# Patient Record
Sex: Male | Born: 1980 | Race: Black or African American | Hispanic: No | Marital: Married | Smoking: Never smoker
Health system: Southern US, Community
[De-identification: ages and names within clinical notes are randomized; demographics above are authoritative.]

## PROBLEM LIST (undated history)

## (undated) DIAGNOSIS — I10 Essential (primary) hypertension: Secondary | ICD-10-CM

## (undated) HISTORY — PX: VASECTOMY: SHX75

## (undated) HISTORY — DX: Essential (primary) hypertension: I10

---

## 2012-10-15 ENCOUNTER — Encounter: Payer: Self-pay | Admitting: Family Medicine

## 2012-10-15 ENCOUNTER — Ambulatory Visit (INDEPENDENT_AMBULATORY_CARE_PROVIDER_SITE_OTHER): Payer: BC Managed Care – PPO | Admitting: Family Medicine

## 2012-10-15 ENCOUNTER — Ambulatory Visit (HOSPITAL_COMMUNITY): Admission: RE | Admit: 2012-10-15 | Payer: BC Managed Care – PPO | Source: Ambulatory Visit

## 2012-10-15 VITALS — BP 170/114 | HR 103 | Temp 97.8°F | Ht 78.0 in | Wt 225.0 lb

## 2012-10-15 DIAGNOSIS — E663 Overweight: Secondary | ICD-10-CM

## 2012-10-15 DIAGNOSIS — N5089 Other specified disorders of the male genital organs: Secondary | ICD-10-CM

## 2012-10-15 DIAGNOSIS — N508 Other specified disorders of male genital organs: Secondary | ICD-10-CM

## 2012-10-15 DIAGNOSIS — Z23 Encounter for immunization: Secondary | ICD-10-CM

## 2012-10-15 DIAGNOSIS — Z6825 Body mass index (BMI) 25.0-25.9, adult: Secondary | ICD-10-CM

## 2012-10-15 DIAGNOSIS — I10 Essential (primary) hypertension: Secondary | ICD-10-CM

## 2012-10-15 LAB — POCT URINALYSIS DIPSTICK
Ketones, UA: NEGATIVE
Leukocytes, UA: NEGATIVE
Protein, UA: 30
pH, UA: 6

## 2012-10-15 LAB — BASIC METABOLIC PANEL
CO2: 26 mEq/L (ref 19–32)
Calcium: 10 mg/dL (ref 8.4–10.5)
Creat: 1.21 mg/dL (ref 0.50–1.35)

## 2012-10-15 LAB — LIPID PANEL
HDL: 58 mg/dL (ref >39)
LDL Cholesterol: 120 mg/dL — ABNORMAL HIGH (ref 0–99)
Total CHOL/HDL Ratio: 3.8 Ratio
VLDL: 44 mg/dL — ABNORMAL HIGH (ref 0–40)

## 2012-10-15 MED ORDER — HYDROCHLOROTHIAZIDE 25 MG PO TABS: 25.0000 mg | ORAL_TABLET | Freq: Every day | ORAL | Status: DC

## 2012-10-15 NOTE — Patient Instructions (Addendum)
Dear Jonathan Nolan.,   It was great to see you today. Thank you for coming to clinic. Please read below regarding the issues that we discussed.   1. Your blood pressure continues to be elevated. We are going to get some basic labwork. Your EKG did show some strain on the left side of your heart from the blood pressure being elevated which means we need to make sure to follow you closely. We will repeat the EKG in a year after your blood pressure is better controlled. I would encourage you to get a blood pressure cuff and perhaps measure every other day and keep a log. The goal is for your blood pressure to be less than 140/90 2. Diet and exercise will be very important as well, see below.  3. You were updated on your flu and tetanus shot.  4. Let's reexamine your right testicle in a few weeks. If the small lump has resolved, nothing further to do. If it persists or gets bigger, we will do an ultrasound.    Please follow up in clinic in 2 weeks . Please call earlier if you have any questions or concerns.   Sincerely,  Dr. Tana Conch   My 5 to Fitness! These are tips I give to every patient that  are important for living a healthy life!   5: servings of fruits and vegetables per day (work on 9 per day if you are at 5)\  *less processed foods* 4: exercise 4-5 times per week for at least 30 minutes (walking counts!) 3: meals per day (don't skip breakfast!) 2: habits to quit  -smoking  -excess alcohol use (men >2 beer/day; women >1beer/day) 1: sweet per day (2 cookies, 1 small cup of ice cream, 12 oz soda)  These are general tips for healthy living. Try to start with 1 or 2 habit TODAY and make it a part of your life for several months. Once you have 1 or 2 habits down for several months, try to begin working on your next healthy habit. With every single step you take, you will be leading a healthier lifestyle!  DASH Diet The DASH diet stands for "Dietary Approaches to Stop  Hypertension." It is a healthy eating plan that has been shown to reduce high blood pressure (hypertension) in as little as 14 days, while also possibly providing other significant health benefits. These other health benefits include reducing the risk of breast cancer after menopause and reducing the risk of type 2 diabetes, heart disease, colon cancer, and stroke. Health benefits also include weight loss and slowing kidney failure in patients with chronic kidney disease.  DIET GUIDELINES  Limit salt (sodium). Your diet should contain less than 1500 mg of sodium daily.  Limit refined or processed carbohydrates. Your diet should include mostly whole grains. Desserts and added sugars should be used sparingly.  Include small amounts of heart-healthy fats. These types of fats include nuts, oils, and tub margarine. Limit saturated and trans fats. These fats have been shown to be harmful in the body. CHOOSING FOODS  The following food groups are based on a 2000 calorie diet. See your Registered Dietitian for individual calorie needs. Grains and Grain Products (6 to 8 servings daily)  Eat More Often: Whole-wheat bread, brown rice, whole-grain or wheat pasta, quinoa, popcorn without added fat or salt (air popped).  Eat Less Often: White bread, white pasta, white rice, cornbread. Vegetables (4 to 5 servings daily)  Eat More Often: Fresh, frozen, and  canned vegetables. Vegetables may be raw, steamed, roasted, or grilled with a minimal amount of fat.  Eat Less Often/Avoid: Creamed or fried vegetables. Vegetables in a cheese sauce. Fruit (4 to 5 servings daily)  Eat More Often: All fresh, canned (in natural juice), or frozen fruits. Dried fruits without added sugar. One hundred percent fruit juice ( cup [237 mL] daily).  Eat Less Often: Dried fruits with added sugar. Canned fruit in light or heavy syrup. [employer], Fish, and Poultry (2 servings or less daily. One serving is 3 to 4 oz [85-114  g]).  Eat More Often: Ninety percent or leaner ground beef, tenderloin, sirloin. Round cuts of beef, chicken breast, [place] breast. All fish. Grill, bake, or broil your meat. Nothing should be fried.  Eat Less Often/Avoid: Fatty cuts of meat, [place], or chicken leg, thigh, or wing. Fried cuts of meat or fish. Dairy (2 to 3 servings)  Eat More Often: Low-fat or fat-free milk, low-fat plain or light yogurt, reduced-fat or part-skim cheese.  Eat Less Often/Avoid: Milk (whole, 2%).Whole milk yogurt. Full-fat cheeses. Nuts, Seeds, and Legumes (4 to 5 servings per week)  Eat More Often: All without added salt.  Eat Less Often/Avoid: Salted nuts and seeds, canned beans with added salt. Fats and Sweets (limited)  Eat More Often: Vegetable oils, tub margarines without trans fats, sugar-free gelatin. Mayonnaise and salad dressings.  Eat Less Often/Avoid: Coconut oils, palm oils, butter, stick margarine, cream, half and half, cookies, candy, pie. FOR MORE INFORMATION The Dash Diet Eating Plan: www.dashdiet.org Document Released: 12/01/2011 Document Revised: 03/05/2012 Document Reviewed: 12/01/2011 [facility] Patient Information 2013 [place], [place].

## 2012-10-17 ENCOUNTER — Encounter: Payer: Self-pay | Admitting: Family Medicine

## 2012-10-17 ENCOUNTER — Ambulatory Visit (INDEPENDENT_AMBULATORY_CARE_PROVIDER_SITE_OTHER): Payer: BC Managed Care – PPO | Admitting: Family Medicine

## 2012-10-17 VITALS — BP 144/96 | HR 92 | Ht 78.0 in | Wt 226.0 lb

## 2012-10-17 DIAGNOSIS — M545 Low back pain: Secondary | ICD-10-CM

## 2012-10-17 DIAGNOSIS — E663 Overweight: Secondary | ICD-10-CM | POA: Insufficient documentation

## 2012-10-17 DIAGNOSIS — S39012A Strain of muscle, fascia and tendon of lower back, initial encounter: Secondary | ICD-10-CM | POA: Insufficient documentation

## 2012-10-17 DIAGNOSIS — N5089 Other specified disorders of the male genital organs: Secondary | ICD-10-CM | POA: Insufficient documentation

## 2012-10-17 NOTE — Assessment & Plan Note (Signed)
This is a recurrent problem given the patient's poor flexibility, inconsistent exercise, and tall height from which he must bend to lift objects such as his children. There were no red flags for spinal impingement. He was given exercises and counseled on the importance of lifting with proper technique. He will use NSAIDS prn for relief. Follow up PRN.

## 2012-10-17 NOTE — Assessment & Plan Note (Addendum)
Poorly controlled. Started on HCTZ. Encouraged regular exercise and DASH diet. Follow up in 2 weeks. Patietn to get cuff at home and record BPs Will complete fundoscopic exam at next visit.   EKG suggestive of left heart strain due given LVH and left atrial enlargement. No murmur to suggest need for echo at this time. Will repeat EKG in 1 year when BP better controlled to see if LVH has improved.  BMET with Cr of 1.2. UA shows proteinuria. Could be early stages of CKD given protein. Will obtain urine microalbumin/creatinine ratio. May need ace inhibitor as alternate therapy or dual therapy. Labwork and initial hisotry and exam do not indicate need to investigate for secondary causes of HTN at this time but given level of elevation and age, will watch closely.   Concerning cholesterol, only risk factor is HTN but patient with protective factor of HDL 58. Goal LDL 160 and currently at 120, no treatment indicated.

## 2012-10-17 NOTE — Patient Instructions (Signed)
Lumbar stretching handout given.

## 2012-10-17 NOTE — Assessment & Plan Note (Addendum)
Very small <1 cm and not in epidiymis area. Patient opted for follow up in 2 weeks with testicular ultrasound if not resolved.

## 2012-10-17 NOTE — Progress Notes (Signed)
Subjective:  Patient presents today to establish care. Chief complaint-noted.   1.  Hypertension-strong family history in mom and dad but doesn't know what age it started. Patient was diagnosed at an urgent care some years ago but does not have any more medication to take.  BP Readings from Last 3 Encounters:  10/15/12 176/122  Home BP monitoring-no Compliant with medications-no meds Denies any CP, HA, SOB, blurry vision, LE edema, transient weakness, orthopnea, PND.  Never had cholesterol screeened  2. Testicular lump-noted 2-3 weeks ago. Painless. Denies nausea/vomiting/fever/chills/fatigue/overall sick feelings.   3. Overweight-BMI 26 and patient without regular exercise. Some dietary indiscretions.   THe following were reviewed and entered/updated in epic: Past Medical History  Diagnosis Date  . Hypertension     diagnosed at an urgetnt care, but has nto taken regular medications   Past Surgical History  Procedure Date  . Vasectomy    Medications- reviewed and updated Reviewed problem list.  Allergies-reviewed and updated History   Social History  . Marital Status: Married    Spouse Name: N/A    Number of Children: N/A  . Years of Education: N/A   Social History Main Topics  . Smoking status: Never Smoker   . Smokeless tobacco: None  . Alcohol Use: 3.0 oz/week    6 drink(s) per week     may every few months have more than 6 beers on one occaion  . Drug Use: None  . Sexually Active: None   Other Topics Concern  . None   Social History Narrative   Walks about a quarter mile to and from work everyday which is what he considers his regular exercise. For about 4 months of year, exercises by playing basketball one a week. 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 Enjoys video games, basketball. Has wife and 3  Children all less than 35 years of age as of October 31. Christian religion but does nto affect care.     ROS--See HPI    Objective: BP 176/122  Pulse 103  Temp 97.8 F (36.6 C) (Oral)  Ht 6\' 6"  (1.981 m)  Wt 225 lb (102.059 kg)  BMI 26.00 kg/m2 BP 170/114  Pulse 103  Temp 97.8 F (36.6 C) (Oral)  Ht 6\' 6"  (1.981 m)  Wt 225 lb (102.059 kg)  BMI 26.00 kg/m2  General Appearance:    Alert, cooperative, no distress, appears stated age  Head:    Normocephalic, without obvious abnormality, atraumatic  Eyes:    PERRL, conjunctiva/corneas clear, EOM's intact, did not perform fundoscopic exam  Ears:    Normal TM's and external ear canals, both ears  Throat:   Lips, mucosa, and tongue normal; teeth and gums normal and moist  Neck:   Supple, symmetrical, no thyromegaly  Lungs:     Clear to auscultation bilaterally, respirations unlabored  Chest wall:    No tenderness or deformity  Heart:    Regular rate and rhythm, S1 and S2 normal, no murmur, rub or gallop  Abdomen:     Soft, non-tender, bowel sounds active all four quadrants,    no masses, no organomegaly  Genitalia:    Normal male without lesion, discharge or tenderness. 1 cm lump on right testicle on side opposite epididymis. Raised less than a mm.   Extremities:   Extremities normal, atraumatic, no cyanosis or edema  Pulses:   2+ and symmetric all extremities  Skin:   Skin color, texture, turgor normal, no rashes or  lesions  Lymph nodes:   Cervical, supraclavicular, and axillary nodes normal  Neurologic:   CNII-XII intact. Normal strength, sensation and reflexes      throughout    EKG-normal sinus rhythm. LVH noted. P wave > 120 ms in lead II suggestive of left atrial enlargement. Also with notched appearance of p wave in lead II  Assessment/Plan: See problem oriented charted

## 2012-10-17 NOTE — Progress Notes (Signed)
  Subjective:    Patient ID: Jonathan Nolan., male    DOB: [birth year], 31 y.o.   MRN: [id number]  HPI  31 year old M with lower back pain.   Back Pain: Location: lower back on left side, minimal radiation to the left anterior thigh Duration: on and off for 3 years which he believes started when moving boxes 3 years ago Quality: 6/10 Current Functional Status:  ADL's - not affected Preceding Events: no falls. no trauma Alleviating Factors:rest, cyclobenzaprine helps, not taking any anti-inflammatories Exacerbating Factors: bending or stretching Hx of intervention: no surgery, no PT Hx of imaging: none Red Flags: no weakness, mild numbness and tingling, no impaired bowel or bladder function  Social: Insurance underwriter, occasionally lifting monitors, most often provides relief; also has kids under the age of 7 that he lifts often  Family Med Hx: arthritis in mother   Review of Systems Negative unless stated in HPI    Objective:   Physical Exam BP 144/96  Pulse 92  Ht 6\' 6"  (1.981 m)  Wt 226 lb (102.513 kg)  BMI 26.12 kg/m2 Gen: well appearing, AAM, non ill appearing, pleasant and conversant MSK:  Cervical Spine - normal ROM, negative spurlings, no spinous process tenderness or deformity  UE: norma ROM of shoulders, 5/5 strength bilaterally  Lumbar Spine -mild TTP of left paraspinal muscle muscles along lower thoracic and upper lumbar spine, negative straight leg raise test bilaterally, no spinous process tenderness or deformity  LE: normal muscle tone, 5/5 strength, poor flexibility of hamstrings bilaterally Neuro: 2+ DTR of biceps (C5) and brachioradialis (C6), patella (L4) and achilles (S1) bilalterally        Assessment & Plan:  31 year old male with paraspinal muscle strain.

## 2012-10-17 NOTE — Assessment & Plan Note (Addendum)
Encouraged regular exercise and DASH diet.   Patient reportedly was fasting during labwork and had elevated CBG of 121. Will consider a1c at next visit.

## 2012-11-07 ENCOUNTER — Ambulatory Visit (INDEPENDENT_AMBULATORY_CARE_PROVIDER_SITE_OTHER): Payer: BC Managed Care – PPO | Admitting: Family Medicine

## 2012-11-07 ENCOUNTER — Encounter: Payer: Self-pay | Admitting: Family Medicine

## 2012-11-07 VITALS — BP 150/98 | HR 88 | Temp 98.1°F | Ht 78.0 in | Wt 226.0 lb

## 2012-11-07 DIAGNOSIS — I10 Essential (primary) hypertension: Secondary | ICD-10-CM

## 2012-11-07 DIAGNOSIS — N5089 Other specified disorders of the male genital organs: Secondary | ICD-10-CM

## 2012-11-07 DIAGNOSIS — E663 Overweight: Secondary | ICD-10-CM

## 2012-11-07 DIAGNOSIS — Z6825 Body mass index (BMI) 25.0-25.9, adult: Secondary | ICD-10-CM

## 2012-11-07 DIAGNOSIS — N508 Other specified disorders of male genital organs: Secondary | ICD-10-CM

## 2012-11-07 MED ORDER — LISINOPRIL 5 MG PO TABS: 5.0000 mg | ORAL_TABLET | Freq: Every day | ORAL | Status: DC

## 2012-11-07 NOTE — Patient Instructions (Addendum)
Keep working on the [employer] even when difficult!  Continue exercising more frequently (5 days a week for 30 minutes).  Start taking Lisinopril 5mg  daily.  Follow up in 2 weeks for a nurse visit for blood pressure and a lab visit to make sure the medicine is affecting your kidneys ok.  We are going to get a urine test today to evaluate the level of protein in your urine.   Check your testicular lump monthly and let's check it here at least on a yearly basis.   Your labs did show that your blood sugars are creeping up so let's check that at least once a year. Your diet should help prevent this.

## 2012-11-08 LAB — MICROALBUMIN / CREATININE URINE RATIO: Microalb Creat Ratio: 3.3 mg/g (ref 0.0–30.0)

## 2012-11-08 NOTE — Progress Notes (Signed)
Subjective:   1. Hypertension- BP Readings from Last 3 Encounters:  11/07/12 150/98  10/17/12 144/96  10/17/12 170/114   Compliant with medications-yes HCTZ without side effects Denies any CP, HA, SOB, blurry vision, LE edema, transient weakness, orthopnea, PND.  Discussed protein in urine could be a sign of kidney damage despite normal Creatinine.    2. Testicular lump-patient states didn't check for a few weeks but then has not been able to find the nodule anymore (thinks might feel it today but cant really tell/not as obvious as previous. Continues to deny nausea/vomiting/fever/chills/fatigue/overall sick feelings.   3. Overweight-BMI 26. Patient has started basketball 3x a week. Discussed elevated fasting CBG and need to recheck within the year to monitor. Discussed lifestyle changes.   ROS--See HPI  Past Medical History-smoking status noted: nonsmoker.  Reviewed problem list.  Medications- reviewed and updated Chief complaint-noted  Objective: BP 150/98  Pulse 88  Temp 98.1 F (36.7 C) (Oral)  Ht 6\' 6"  (1.981 m)  Wt 226 lb (102.513 kg)  BMI 26.12 kg/m2 Gen: NAD HEENT: fundoscopic exam-no gross papilledema or vessel abnormalities CV: RRR no mrg Lungs: CTAB Testicular exam: unable to locate any bump/mass/nodule.   Assessment/Plan: See problem oriented charted

## 2012-11-08 NOTE — Assessment & Plan Note (Signed)
Still poorly controlled with 1 agent and attempted DASH diet. Will add Lisinopril and recheck creatinine/BP in 2 weeks. Urine microalbumin/creatinine ratio not elevated. First urine check could have either been transient or orthostatic. Will recheck UA in 1 year.

## 2012-11-08 NOTE — Assessment & Plan Note (Signed)
Discussed continued lifestyle changes. DIscussed elevated fasting CBG and the fact we will recheck in 1 year.

## 2012-11-08 NOTE — Assessment & Plan Note (Signed)
Will continue to follow yearly. Patient to do monthly self exams. APpears lump has resolved.

## 2012-11-12 ENCOUNTER — Telehealth: Payer: Self-pay | Admitting: Family Medicine

## 2012-11-12 NOTE — Telephone Encounter (Signed)
Informed patient of non elevated microalbumin/cr ratio by voicemail. Plan to repeat UA yearly.

## 2012-11-21 ENCOUNTER — Telehealth: Payer: Self-pay | Admitting: Family Medicine

## 2012-11-21 NOTE — Telephone Encounter (Signed)
Patient is calling to find out if he still needs to come in for labs on Monday.

## 2012-11-21 NOTE — Telephone Encounter (Signed)
Patient informed that there are future orders in for a CMET, patient expressed understanding.

## 2012-11-26 ENCOUNTER — Other Ambulatory Visit: Payer: BC Managed Care – PPO

## 2012-12-03 ENCOUNTER — Other Ambulatory Visit: Payer: BC Managed Care – PPO

## 2012-12-03 ENCOUNTER — Ambulatory Visit (INDEPENDENT_AMBULATORY_CARE_PROVIDER_SITE_OTHER): Payer: BC Managed Care – PPO | Admitting: *Deleted

## 2012-12-03 VITALS — BP 150/100 | HR 88

## 2012-12-03 DIAGNOSIS — I10 Essential (primary) hypertension: Secondary | ICD-10-CM

## 2012-12-03 LAB — COMPREHENSIVE METABOLIC PANEL
ALT: 26 U/L (ref 0–53)
CO2: 26 mEq/L (ref 19–32)
Creat: 1.15 mg/dL (ref 0.50–1.35)
Glucose, Bld: 111 mg/dL — ABNORMAL HIGH (ref 70–99)
Total Bilirubin: 0.5 mg/dL (ref 0.3–1.2)

## 2012-12-03 NOTE — Progress Notes (Signed)
CMP DONE TODAY Jonathan Nolan 

## 2012-12-04 NOTE — Progress Notes (Signed)
Patient in for BP check . BP LA 150/100  And RA 154/100 pulse 88. Taking medication as directed. Dr. Durene Cal notified of BP readings from  Today. Labs were also drawn today. He will obtain report tomorrow and make decision based on that.

## 2012-12-05 ENCOUNTER — Telehealth: Payer: Self-pay | Admitting: Family Medicine

## 2012-12-05 DIAGNOSIS — I10 Essential (primary) hypertension: Secondary | ICD-10-CM

## 2012-12-05 MED ORDER — LISINOPRIL 20 MG PO TABS: 20.0000 mg | ORAL_TABLET | Freq: Every day | ORAL | Status: DC

## 2012-12-05 NOTE — Telephone Encounter (Signed)
Discussed with patient elevated BP despite medicine. Updated on Cr unchanged on medicine. Patient agreeable to 20mg  Lisinopril and 2 week recheck on BP. Plan for 40mg  if still uncontrolled.  Discussed if we cannot control medicine with maximizing lisinopril I may want to get further studies given patient's age and fact relatively athletic.

## 2013-05-03 ENCOUNTER — Encounter: Payer: Self-pay | Admitting: Family Medicine

## 2013-05-03 ENCOUNTER — Ambulatory Visit (INDEPENDENT_AMBULATORY_CARE_PROVIDER_SITE_OTHER): Payer: BC Managed Care – PPO | Admitting: Family Medicine

## 2013-05-03 VITALS — BP 129/84 | HR 116 | Temp 99.9°F | Ht 78.0 in | Wt 212.0 lb

## 2013-05-03 DIAGNOSIS — J069 Acute upper respiratory infection, unspecified: Secondary | ICD-10-CM | POA: Insufficient documentation

## 2013-05-03 NOTE — Patient Instructions (Signed)
Nice to meet you today. You should continue to feel better in the next few days. For your congestion you can try pseudophed. You can also try over the counter cough medications. If you get worse by the middle of next week, please come back in to be seen.

## 2013-05-03 NOTE — Assessment & Plan Note (Signed)
Patient with likely viral URI given improvement in symptoms shortly after start of illness. Plan: advised pseudophed for congestion and OTC cough medications. If worsens by middle of next week, advised to return to be seen.

## 2013-05-03 NOTE — Progress Notes (Signed)
  Subjective:    Patient ID: Jonathan Nolan., male    DOB: Jul 23, 1981, 32 y.o.   MRN: [id number]  Sinusitis   Patient is a 32 yo male presenting for sinus infection.  States started Tuesday. Congestion worse at night. Drainage. HA on R side. Some ear fullness. Wife states felt hot, though did not check temp. Getting better today. Took generic allergy medication and robitussin for cough with some relief.  Review of Systems see HPI     Objective:   Physical Exam  Constitutional: He appears well-developed and well-nourished.  HENT:  Head: Normocephalic and atraumatic.  Nose: Nose normal.  Mouth/Throat: Oropharynx is clear and moist. No oropharyngeal exudate.  Bilateral TM normal  Eyes: Conjunctivae are normal. Pupils are equal, round, and reactive to light. Right eye exhibits no discharge. Left eye exhibits no discharge.  Neck: Neck supple.  Cardiovascular: Normal rate, regular rhythm and normal heart sounds.   Pulmonary/Chest: Effort normal and breath sounds normal.  Lymphadenopathy:    He has no cervical adenopathy.  Skin: Skin is warm and dry.  BP 129/84  Pulse 116  Temp(Src) 99.9 F (37.7 C) (Oral)  Ht 6\' 6"  (1.981 m)  Wt 212 lb (96.163 kg)  BMI 24.5 kg/m2    Assessment & Plan:

## 2013-08-17 ENCOUNTER — Emergency Department (HOSPITAL_COMMUNITY)
Admission: EM | Admit: 2013-08-17 | Discharge: 2013-08-18 | Disposition: A | Discharge status: Home / Self Care | Payer: BC Managed Care – PPO | Attending: Emergency Medicine | Admitting: Emergency Medicine

## 2013-08-17 ENCOUNTER — Encounter (HOSPITAL_COMMUNITY): Payer: Self-pay | Admitting: *Deleted

## 2013-08-17 DIAGNOSIS — I10 Essential (primary) hypertension: Secondary | ICD-10-CM | POA: Insufficient documentation

## 2013-08-17 DIAGNOSIS — R9431 Abnormal electrocardiogram [ECG] [EKG]: Secondary | ICD-10-CM | POA: Insufficient documentation

## 2013-08-17 DIAGNOSIS — R079 Chest pain, unspecified: Secondary | ICD-10-CM

## 2013-08-17 DIAGNOSIS — Z79899 Other long term (current) drug therapy: Secondary | ICD-10-CM | POA: Insufficient documentation

## 2013-08-17 DIAGNOSIS — R0789 Other chest pain: Secondary | ICD-10-CM | POA: Insufficient documentation

## 2013-08-17 LAB — CBC
HCT: 37.1 % — ABNORMAL LOW (ref 39.0–52.0)
MCH: 30.4 pg (ref 26.0–34.0)
MCV: 85.5 fL (ref 78.0–100.0)
RBC: 4.34 MIL/uL (ref 4.22–5.81)
WBC: 8.8 10*3/uL (ref 4.0–10.5)

## 2013-08-17 LAB — BASIC METABOLIC PANEL
BUN: 11 mg/dL (ref 6–23)
CO2: 26 mEq/L (ref 19–32)
Calcium: 9.2 mg/dL (ref 8.4–10.5)
Chloride: 100 mEq/L (ref 96–112)
Creatinine, Ser: 1.19 mg/dL (ref 0.50–1.35)

## 2013-08-17 MED ORDER — ASPIRIN 325 MG PO TABS
325.0000 mg | ORAL_TABLET | Freq: Once | ORAL | Status: AC
  Administered 2013-08-17: 325 mg via ORAL
  Filled 2013-08-17: qty 1

## 2013-08-17 MED ORDER — METOPROLOL TARTRATE 1 MG/ML IV SOLN
2.5000 mg | Freq: Once | INTRAVENOUS | Status: AC
  Administered 2013-08-18: 2.5 mg via INTRAVENOUS
  Filled 2013-08-17: qty 5

## 2013-08-17 MED ORDER — POTASSIUM CHLORIDE CRYS ER 20 MEQ PO TBCR
60.0000 meq | EXTENDED_RELEASE_TABLET | Freq: Once | ORAL | Status: AC
  Administered 2013-08-17: 60 meq via ORAL
  Filled 2013-08-17: qty 3

## 2013-08-17 NOTE — ED Notes (Signed)
Pt states that after eating developed substernal CP described as tightness, with diaphoresis.

## 2013-08-17 NOTE — ED Notes (Signed)
Pt states pain exacerbated by sitting down, and relived by standing up

## 2013-08-17 NOTE — ED Provider Notes (Signed)
CSN: [id number]     Arrival date & time 08/17/13  2134 History     First MD Initiated Contact with Patient 08/17/13 2256     Chief Complaint  Patient presents with  . Chest Pain   (Consider location/radiation/quality/duration/timing/severity/associated sxs/prior Treatment) HPI Comments: 32 yo male with htn hx presents after 30 min episode of chest tightness that started after eating a spicy burrito.  Pt has not GI or cardiac hx.  Pt was told he has mild strain on his ekg in the past.  No stress test hx.  No exertional cp or sob however yesterday he felt tired after cutting the grass.  No pain with exercise.  Patient denies blood clot history, active cancer, recent major trauma or surgery, unilateral leg swelling/ pain, recent long travel, hemoptysis.  Pt has no sxs in ED.    Patient is a 32 y.o. male presenting with chest pain. The history is provided by the patient and the spouse.  Chest Pain Pain location:  Substernal area and epigastric Pain quality: tightness   Pain radiates to:  Does not radiate Pain radiates to the back: no   Onset quality:  Gradual Duration:  30 minutes Timing:  Constant Progression:  Resolved Chronicity:  New Relieved by:  None tried Worsened by:  Nothing tried Associated symptoms: no abdominal pain, no back pain, no fever, no headache, no shortness of breath and not vomiting     Past Medical History  Diagnosis Date  . Hypertension     diagnosed at an urgetnt care, but has nto taken regular medications   Past Surgical History  Procedure Laterality Date  . Vasectomy     Family History  Problem Relation Age of Onset  . Prostate cancer      grandfather  . Hypertension Mother   . Hypertension Father    History  Substance Use Topics  . Smoking status: Never Smoker   . Smokeless tobacco: Not on file  . Alcohol Use: 3.0 oz/week    6 drink(s) per week     Comment: may every few months have more than 6 beers on one occaion    Review of Systems   Constitutional: Negative for fever and chills.  HENT: Negative for neck pain and neck stiffness.   Eyes: Negative for visual disturbance.  Respiratory: Negative for shortness of breath.   Cardiovascular: Positive for chest pain.  Gastrointestinal: Negative for vomiting and abdominal pain.  Genitourinary: Negative for dysuria and flank pain.  Musculoskeletal: Negative for back pain.  Skin: Negative for rash.  Neurological: Negative for syncope, light-headedness and headaches.    Allergies  Review of patient's allergies indicates no known allergies.  Home Medications   Current Outpatient Rx  Name  Route  Sig  Dispense  Refill  . hydrochlorothiazide (HYDRODIURIL) 25 MG tablet   Oral   Take 1 tablet (25 mg total) by mouth daily.   90 tablet   3   . lisinopril (PRINIVIL,ZESTRIL) 20 MG tablet   Oral   Take 1 tablet (20 mg total) by mouth daily.   30 tablet   3    BP 188/104  Pulse 110  Temp(Src) 98.3 F (36.8 C) (Oral)  Resp 16  SpO2 99% Physical Exam  Nursing note and vitals reviewed. Constitutional: He is oriented to person, place, and time. He appears well-developed and well-nourished.  HENT:  Head: Normocephalic and atraumatic.  Eyes: Conjunctivae are normal. Right eye exhibits no discharge. Left eye exhibits no discharge.  Neck:  Normal range of motion. Neck supple. No tracheal deviation present.  Cardiovascular: Regular rhythm.  Tachycardia present.   No murmur heard. Pulses:      Radial pulses are 2+ on the right side, and 2+ on the left side.  Pulmonary/Chest: Effort normal and breath sounds normal.  Abdominal: Soft. He exhibits no distension. There is no tenderness. There is no guarding.  Musculoskeletal: He exhibits no edema and no tenderness.  Neurological: He is alert and oriented to person, place, and time. No cranial nerve deficit.  Skin: Skin is warm. No rash noted.  Psychiatric: He has a normal mood and affect.    ED Course   Procedures (including  critical care time)  Labs Reviewed  CBC - Abnormal; Notable for the following:    HCT 37.1 (*)    All other components within normal limits  BASIC METABOLIC PANEL - Abnormal; Notable for the following:    Potassium 2.8 (*)    Glucose, Bld 198 (*)    GFR calc non Af Amer 79 (*)    All other components within normal limits  TROPONIN I  MAGNESIUM  TROPONIN I  POCT I-STAT TROPONIN I   No results found. No diagnosis found.  MDM  No sxs in ED. HPI most consistent with GI related however with mild T wave inversions, chest pain and HTN -- CAD on differential.  Plan for delta troponin and asa. No pe risks. HTN discussed and very close fup for improved control. Discussed observation in hospital.  Patient has capacity to make decisions, understands benefits of hospitalization and risks of going home may result in worsening health condition including heart attack.  Patient refuses hospital placement. Patient understands they may return at any time.  Denies chol, dm, smoking or fh cardiac.  Date: 08/17/2013  Rate: 110  Rhythm: sinus tachycardia  QRS Axis: normal  Intervals: QT prolonged  ST/T Wave abnormalities: T wave inversions inferior  Conduction Disutrbances:none  Narrative Interpretation:   Old EKG Reviewed: changes noted tachy, T wave inversions  K given.  Small metoprolol dose given.  Vitals improved in ed. Repeat ekg due to T wave changes/ tachy initially, now pt comfortable, hr normal.  Date: 08/18/2013  Rate: 85  Rhythm: normal sinus rhythm  QRS Axis: normal  Intervals: normal  ST/T Wave abnormalities: early repolarization  Conduction Disutrbances:none  Narrative Interpretation:   Old EKG Reviewed: changes noted No long t wave inversions CXR no acute findings, reviewed.  Outpt stress [place] discussed with pt and wife.  Dg Chest 2 View  08/18/2013   *RADIOLOGY REPORT*  Clinical Data: Mid sternal chest pain and shortness of breath after dinner.  CHEST - 2 VIEW  Comparison:  None.  Findings: The heart size and pulmonary vascularity are normal. The lungs appear clear and expanded without focal air space disease or consolidation. No blunting of the costophrenic angles.  No pneumothorax.  Mediastinal contours appear intact.  Mild thoracolumbar curvature convex towards the left.  IMPRESSION: No evidence of active pulmonary disease.   Original Report Authenticated By: Burman Nieves, M.D.       Enid Skeens, MD 08/18/13 0157

## 2013-08-18 ENCOUNTER — Emergency Department (HOSPITAL_COMMUNITY): Payer: BC Managed Care – PPO

## 2013-08-18 LAB — POCT I-STAT TROPONIN I: Troponin i, poc: 0.01 ng/mL (ref 0.00–0.08)

## 2013-09-02 ENCOUNTER — Ambulatory Visit: Payer: BC Managed Care – PPO | Admitting: Family Medicine

## 2013-09-13 ENCOUNTER — Ambulatory Visit (INDEPENDENT_AMBULATORY_CARE_PROVIDER_SITE_OTHER): Payer: BC Managed Care – PPO | Admitting: Family Medicine

## 2013-09-13 ENCOUNTER — Encounter: Payer: Self-pay | Admitting: Family Medicine

## 2013-09-13 VITALS — BP 153/108 | HR 87 | Temp 98.2°F | Wt 213.0 lb

## 2013-09-13 DIAGNOSIS — I10 Essential (primary) hypertension: Secondary | ICD-10-CM

## 2013-09-13 DIAGNOSIS — E876 Hypokalemia: Secondary | ICD-10-CM

## 2013-09-13 DIAGNOSIS — R079 Chest pain, unspecified: Secondary | ICD-10-CM

## 2013-09-13 MED ORDER — AMLODIPINE BESYLATE 10 MG PO TABS: 10.0000 mg | ORAL_TABLET | Freq: Every day | ORAL | Status: DC

## 2013-09-13 NOTE — Assessment & Plan Note (Signed)
Poorly controlled both SBP and DBP. Will add amlodipine 10mg  to lisinopril 20mg  and hctz 25mg . See back in 2 weeks, may need to increase lisinopril to full dose 40mg .

## 2013-09-13 NOTE — Patient Instructions (Signed)
We are getting you set up for a stress test.  We are going to check your cholesterol today-we will let you know if you need a cholesterol lowering medicine.  Take an aspirin 81mg  daily for now.  Take the new medicine norvasc for your blood pressure  See me in 2 weeks,  Dr. Durene Cal  You know reasons to go to ED as well

## 2013-09-13 NOTE — Progress Notes (Signed)
[facility] Tana Conch, MD Phone: [phone]  Subjective:  Chief complaint-noted  # Chest Pain Review of records-seen in ED on 8/23 after eating burrito and having 30 minutes of central nonradiating chest pain associated with exertional shortness of breath. Thought to be GERD. Initial EKG with some t wave inversions in inferior leads and qt prolongation although qtc <500, resolved on repeat. 2 troponins with last about 6 hours from event. Offered observation admission but patient refused.   Since that visit, patient denies chest pain. He reports to me he had not been taking blood pressure medications that entire week and per records BP 188/104 on arrival to ED. Patient states he had worsening SOB when going up stairs during that event. Since that time denies any chest pain or shortness of breath. He has never experienced anything like this before.   Family history-no history early MI   ROS- No epigastric burning after meals, no burping. No diaphoresis with event. No radiation of pain. No unintentional weight loss. \  # Hypertension BP Readings from Last 3 Encounters:  09/13/13 153/108  08/18/13 152/95  05/03/13 129/84  Did Dash diet early last year but is no longer doing.  Home BP monitoring-no Compliant with medications-yes without side effects, lisinopril and hctz Denies any current or within last week-CP, HA, SOB, blurry vision, LE edema, transient weakness, orthopnea, PND.   # Hypokalemia Noted in ED to 2.8. Patient states no history of this. Had not been on BP meds.  ROS-no weakness or cramping.  Past Medical History Patient Active Problem List   Diagnosis Date Noted  . Hypertension 10/15/2012    Priority: Medium  . Testicular lump 10/17/2012    Priority: Low  . Strain of lumbar paraspinal muscle 10/17/2012    Priority: Low   Reviewed problem list.  Medications- reviewed and updated Current Outpatient Prescriptions on File Prior to Visit   Medication Sig Dispense Refill  . hydrochlorothiazide (HYDRODIURIL) 25 MG tablet Take 1 tablet (25 mg total) by mouth daily.  90 tablet  3  . lisinopril (PRINIVIL,ZESTRIL) 20 MG tablet Take 1 tablet (20 mg total) by mouth daily.  30 tablet  3   No current facility-administered medications on file prior to visit.    Objective: BP 153/108  Pulse 87  Temp(Src) 98.2 F (36.8 C) (Oral)  Wt 213 lb (96.616 kg)  BMI 24.62 kg/m2 Gen: NAD, resting comfortably in chair CV: RRR no murmurs rubs or gallops Chest: no chest wall tenderness Lungs: CTAB no crackles, wheeze, rhonchi Skin: warm, dry Neuro: grossly normal, moves all extremities Ext: no edema   Assessment/Plan:  # Chest Pain Atypical and suspect GERD or GI etiology after large meal. Given uncontrolled blood pressure history and SOb with activity during event believe a stress test is warranted so have sent for exercise stress test with [place].   In meantime, will recheck lipids and start statin if elevated (will likely use framingham as under age 56). Advised aspirin until stress test.   # Hypokalemia Recheck BMET. Asymptomatic.   Will discuss at 2 week follow up:  Health Maintenance Due  Topic Date Due  . Influenza Vaccine  07/26/2013

## 2013-10-03 ENCOUNTER — Ambulatory Visit (INDEPENDENT_AMBULATORY_CARE_PROVIDER_SITE_OTHER): Payer: BC Managed Care – PPO | Admitting: Physician Assistant

## 2013-10-03 DIAGNOSIS — R079 Chest pain, unspecified: Secondary | ICD-10-CM

## 2013-10-03 NOTE — Progress Notes (Signed)
Exercise Treadmill Test  Pre-Exercise Testing Evaluation Rhythm: sinus tachycardia  Rate: 115 bpm     Test  Exercise Tolerance Test Ordering MD: Lewayne Bunting, MD  Interpreting MD: Jacolyn Reedy  Unique Test No:1 Treadmill:  1  Indication for ETT: chest pain - rule out ischemia  Contraindication to ETT: No   Stress Modality: exercise - treadmill  Cardiac Imaging Performed: non   Protocol: standard Bruce - maximal  Max BP:  191/61  Max MPHR (bpm):  187 85% MPR (bpm):  168  MPHR obtained (bpm):  190 % MPHR obtained:  101  Reached 85% MPHR (min:sec):  5:36 Total Exercise Time (min-sec):  9:28  Workload in METS:  10.8 Borg Scale: 13  Reason ETT Terminated:  fatigue    ST Segment Analysis At Rest: non-specific ST segment slurring With Exercise: no evidence of significant ST depression  Other Information Arrhythmia:  No Angina during ETT:  absent (0) Quality of ETT:  diagnostic  ETT Interpretation:  normal - no evidence of ischemia by ST analysis  Comments: Resting sinus tachycardia. HR 141bpm when he first arrived, came down to 105, was 117 at the start of exercise. Patient a bit nervous but couldn't feel tachycardia. Good exercise tolerance. No chest pain or EKG changes.  Recommendations: F/u Dr. Durene Cal. Consider 2Decho with HTN and resting tachycardia.

## 2013-10-07 ENCOUNTER — Ambulatory Visit (INDEPENDENT_AMBULATORY_CARE_PROVIDER_SITE_OTHER): Payer: BC Managed Care – PPO | Admitting: Family Medicine

## 2013-10-07 ENCOUNTER — Encounter: Payer: Self-pay | Admitting: Family Medicine

## 2013-10-07 VITALS — BP 154/96 | HR 99 | Temp 98.1°F | Ht 78.0 in | Wt 214.0 lb

## 2013-10-07 DIAGNOSIS — I10 Essential (primary) hypertension: Secondary | ICD-10-CM

## 2013-10-07 DIAGNOSIS — Z23 Encounter for immunization: Secondary | ICD-10-CM

## 2013-10-07 DIAGNOSIS — R000 Tachycardia, unspecified: Secondary | ICD-10-CM

## 2013-10-07 LAB — LIPID PANEL
Cholesterol: 189 mg/dL (ref 0–200)
HDL: 53 mg/dL (ref >39)
Total CHOL/HDL Ratio: 3.6 Ratio
Triglycerides: 107 mg/dL (ref <150)
VLDL: 21 mg/dL (ref 0–40)

## 2013-10-07 LAB — TSH: TSH: 0.844 u[IU]/mL (ref 0.350–4.500)

## 2013-10-07 LAB — BASIC METABOLIC PANEL
Potassium: 4 mEq/L (ref 3.5–5.3)
Sodium: 138 mEq/L (ref 135–145)

## 2013-10-07 MED ORDER — LISINOPRIL 40 MG PO TABS: 40.0000 mg | ORAL_TABLET | Freq: Every day | ORAL | Status: DC

## 2013-10-07 NOTE — Progress Notes (Addendum)
[facility] Tana Conch, MD Phone: [phone]  Subjective:  Chief complaint-noted   # Hypertension BP Readings from Last 3 Encounters:  10/07/13 157/99  09/13/13 153/108  08/18/13 152/95  Home BP monitoring-no Compliant with medications-lisinopril 20 mg, hctz 25mg , amlodipine 10mg . Took all this morning. No side effects ROS-Denies any recent chest pain or shortness of breath since his occurrence that led to stress testing. Patient does snore but denies daytime sleepiness. Denies diarrhea or heat intolerance. Denies headaches, flushing or sweating in spells.   We reviewed his stress test as well as cardiology recs for 2-d echo given HTN and resting tachycardia at the office.   Past Medical History Patient Active Problem List   Diagnosis Date Noted  . Hypertension 10/15/2012    Priority: Medium  . Testicular lump 10/17/2012    Priority: Low  . Strain of lumbar paraspinal muscle 10/17/2012    Priority: Low   Family history-mother, father, 2 sisters with HTN. Patient states father had to have adrenal glands removed for headache (does not remember pheochromocytoma specifically)  Medications- see HTN section, told patient he can stop daily aspirin   Objective: BP 154/96  Pulse 99  Temp(Src) 98.1 F (36.7 C) (Oral)  Ht 6\' 6"  (1.981 m)  Wt 214 lb (97.07 kg)  BMI 24.74 kg/m2 Gen: NAD, resting comfortably in chair, tall, thin CV: RRR no murmurs rubs or gallops Lungs: CTAB no crackles, wheeze, rhonchi Abd; soft/nontender/nondistended Ext: no edema, 2+ DP pulses Skin: warm, dry Neuro: grossly normal, moves all extremities  Assessment/Plan:

## 2013-10-07 NOTE — Assessment & Plan Note (Addendum)
No recurrence of chest pain. Reviewed negative stress test with patient.   Poorly controlled on maxed out amlodipine and HCTZ.  Will max out lisinopril as well.  Patient to buy home cuff to compare to measurements here and record log over next month. Possible white coat hypertension contributing.  GIven history of tachycardia at cards office, will check TSH and agree with cards recs to get 2-d echocardiogram. Recheck bmet as well with recent low potassium. Also check lipids and would use framingham score as under age 45.  Patient to follow up with me in about a month to determine if we need to go down the below pathways.   Would consider ambulatory monitoring through pharmacy clinic as well.   Finally, I am concerned about secondary causes of hypertension though patient has not maxed out on 3 agents yet.  Father with adrenal history but no current symptoms to suggest pheochromocytoma.  OSA could contribute with snoring. Would consider sleep study.  Hypokalemia at previous visit to ED and concern for hyperaldosteronism-would consider aldsoterone/renin ratio (within 2 hours of waking and upright) Thyroid being tested with TSH.  Intact DP pulses so doubt coarctation of aorta.

## 2013-10-07 NOTE — Patient Instructions (Signed)
[facility],   Thanks for coming today.   Plan: 1. Get echocardiogram as suggested by cardiology 2. Increase lisinopril to 40mg .  3. Have you set up with pharmacy clinic. We want to see if your blood pressure simply goes up when coming into our office.   4. There is some additional testing we want to consider but we want to start with the above steps.   Thanks, Dr. Durene Cal  Please make an appointment with [facility] for 24-hour blood pressure monitoring at front desk.   What to expect at your blood pressure monitoring visit:  Please wear a short-sleeved, loose fitting shirt for the day. Try to take a shower/bathe before you come in for the appointment so that you don't have to take the monitor off during the day. On the day of your appointment, a portable blood pressure cuff will be placed on your arm and you will wear it for 24 hours. It will inflate multiple times during the day and night which will give [place] a better idea of your overall blood pressure. The next day you will return to the clinic to take the monitor off and the results of the blood pressure study will be shown to you. If medication changes are needed, they will be made that day.

## 2013-10-14 ENCOUNTER — Encounter: Payer: Self-pay | Admitting: Family Medicine

## 2013-10-18 ENCOUNTER — Other Ambulatory Visit (HOSPITAL_COMMUNITY): Payer: Self-pay | Admitting: Family Medicine

## 2013-10-18 DIAGNOSIS — I10 Essential (primary) hypertension: Secondary | ICD-10-CM

## 2013-10-21 ENCOUNTER — Ambulatory Visit (HOSPITAL_COMMUNITY): Payer: BC Managed Care – PPO | Attending: Cardiovascular Disease | Admitting: Radiology

## 2013-10-21 DIAGNOSIS — R072 Precordial pain: Secondary | ICD-10-CM

## 2013-10-21 DIAGNOSIS — R079 Chest pain, unspecified: Secondary | ICD-10-CM | POA: Insufficient documentation

## 2013-10-21 DIAGNOSIS — I079 Rheumatic tricuspid valve disease, unspecified: Secondary | ICD-10-CM | POA: Insufficient documentation

## 2013-10-21 DIAGNOSIS — I10 Essential (primary) hypertension: Secondary | ICD-10-CM | POA: Insufficient documentation

## 2013-10-21 DIAGNOSIS — R000 Tachycardia, unspecified: Secondary | ICD-10-CM | POA: Insufficient documentation

## 2013-10-21 NOTE — Progress Notes (Signed)
Echocardiogram performed.  

## 2013-10-28 ENCOUNTER — Encounter: Payer: Self-pay | Admitting: Family Medicine

## 2014-01-31 ENCOUNTER — Other Ambulatory Visit: Payer: Self-pay | Admitting: Family Medicine

## 2014-08-15 ENCOUNTER — Other Ambulatory Visit: Payer: Self-pay | Admitting: Family Medicine

## 2014-08-17 ENCOUNTER — Other Ambulatory Visit: Payer: Self-pay | Admitting: Family Medicine

## 2014-08-18 NOTE — Telephone Encounter (Signed)
Need PCP apt prior to additional refills

## 2014-09-04 ENCOUNTER — Ambulatory Visit: Payer: BC Managed Care – PPO | Admitting: Family Medicine

## 2014-09-11 ENCOUNTER — Ambulatory Visit: Payer: BC Managed Care – PPO | Admitting: Family Medicine

## 2014-09-15 ENCOUNTER — Encounter: Payer: Self-pay | Admitting: Family Medicine

## 2014-09-15 ENCOUNTER — Ambulatory Visit (INDEPENDENT_AMBULATORY_CARE_PROVIDER_SITE_OTHER): Payer: BC Managed Care – PPO | Admitting: Family Medicine

## 2014-09-15 VITALS — BP 150/85 | HR 78 | Ht 78.0 in | Wt 203.0 lb

## 2014-09-15 DIAGNOSIS — Z23 Encounter for immunization: Secondary | ICD-10-CM | POA: Diagnosis not present

## 2014-09-15 DIAGNOSIS — I10 Essential (primary) hypertension: Secondary | ICD-10-CM

## 2014-09-15 LAB — CBC
HCT: 37.7 % — ABNORMAL LOW (ref 39.0–52.0)
HEMOGLOBIN: 12.6 g/dL — AB (ref 13.0–17.0)
MCH: 29.1 pg (ref 26.0–34.0)
MCHC: 33.4 g/dL (ref 30.0–36.0)
MCV: 87.1 fL (ref 78.0–100.0)
Platelets: 412 10*3/uL — ABNORMAL HIGH (ref 150–400)
RBC: 4.33 MIL/uL (ref 4.22–5.81)
RDW: 13.5 % (ref 11.5–15.5)
WBC: 7.1 10*3/uL (ref 4.0–10.5)

## 2014-09-15 LAB — COMPREHENSIVE METABOLIC PANEL
ALBUMIN: 4.4 g/dL (ref 3.5–5.2)
ALT: 22 U/L (ref 0–53)
AST: 19 U/L (ref 0–37)
Alkaline Phosphatase: 53 U/L (ref 39–117)
BUN: 17 mg/dL (ref 6–23)
CALCIUM: 9.7 mg/dL (ref 8.4–10.5)
CHLORIDE: 101 meq/L (ref 96–112)
CO2: 27 mEq/L (ref 19–32)
Creat: 1.24 mg/dL (ref 0.50–1.35)
Glucose, Bld: 105 mg/dL — ABNORMAL HIGH (ref 70–99)
POTASSIUM: 3.7 meq/L (ref 3.5–5.3)
Sodium: 136 mEq/L (ref 135–145)
Total Bilirubin: 0.8 mg/dL (ref 0.2–1.2)
Total Protein: 7.1 g/dL (ref 6.0–8.3)

## 2014-09-15 MED ORDER — HYDROCHLOROTHIAZIDE 25 MG PO TABS: ORAL_TABLET | ORAL | Status: DC

## 2014-09-15 NOTE — Assessment & Plan Note (Addendum)
BP elevated today but has not taking HCTZ for several months "Rx ran out"; However he checks his BP at home at always < 140/90 - He does report snoring and strong FHx of HTN as well as eating fast food often - consider sleep study if requires more medications - Restart HCTZ - check CMP, CBC

## 2014-09-15 NOTE — Patient Instructions (Signed)
It was great seeing you today.   1. Restart your HCTZ 2. I will check some blood work today, and call if anything is abnormal   Please bring all your medications to every doctors visit  Sign up for My Chart to have easy access to your labs results, and communication with your Primary care physician.  Next Appointment  Please call to make an appointment with Dr Gayla Doss in 1 year, or sooner if he has any additional questions/concerns   I look forward to talking with you again at our next visit. If you have any questions or concerns before then, please call the clinic at [phone].  Take Care,   Dr Wenda Low  Hypertension Hypertension, commonly called high blood pressure, is when the force of blood pumping through your arteries is too strong. Your arteries are the blood vessels that carry blood from your heart throughout your body. A blood pressure reading consists of a higher number over a lower number, such as 110/72. The higher number (systolic) is the pressure inside your arteries when your heart pumps. The lower number (diastolic) is the pressure inside your arteries when your heart relaxes. Ideally you want your blood pressure below 120/80. Hypertension forces your heart to work harder to pump blood. Your arteries may become narrow or stiff. Having hypertension puts you at risk for heart disease, stroke, and other problems.  RISK FACTORS Some risk factors for high blood pressure are controllable. Others are not.  Risk factors you cannot control include:   Race. You may be at higher risk if you are African American.  Age. Risk increases with age.  Gender. Men are at higher risk than women before age 65 years. After age 4, women are at higher risk than men. Risk factors you can control include:  Not getting enough exercise or physical activity.  Being overweight.  Getting too much fat, sugar, calories, or salt in your diet.  Drinking too much alcohol. SIGNS AND  SYMPTOMS Hypertension does not usually cause signs or symptoms. Extremely high blood pressure (hypertensive crisis) may cause headache, anxiety, shortness of breath, and nosebleed. DIAGNOSIS  To check if you have hypertension, your health care provider will measure your blood pressure while you are seated, with your arm held at the level of your heart. It should be measured at least twice using the same arm. Certain conditions can cause a difference in blood pressure between your right and left arms. A blood pressure reading that is higher than normal on one occasion does not mean that you need treatment. If one blood pressure reading is high, ask your health care provider about having it checked again. TREATMENT  Treating high blood pressure includes making lifestyle changes and possibly taking medicine. Living a healthy lifestyle can help lower high blood pressure. You may need to change some of your habits. Lifestyle changes may include:  Following the DASH diet. This diet is high in fruits, vegetables, and whole grains. It is low in salt, red meat, and added sugars.  Getting at least 2 hours of brisk physical activity every week.  Losing weight if necessary.  Not smoking.  Limiting alcoholic beverages.  Learning ways to reduce stress. If lifestyle changes are not enough to get your blood pressure under control, your health care provider may prescribe medicine. You may need to take more than one. Work closely with your health care provider to understand the risks and benefits. HOME CARE INSTRUCTIONS  Have your blood pressure rechecked as  directed by your health care provider.   Take medicines only as directed by your health care provider. Follow the directions carefully. Blood pressure medicines must be taken as prescribed. The medicine does not work as well when you skip doses. Skipping doses also puts you at risk for problems.   Do not smoke.   Monitor your blood pressure at  home as directed by your health care provider. SEEK MEDICAL CARE IF:   You think you are having a reaction to medicines taken.  You have recurrent headaches or feel dizzy.  You have swelling in your ankles.  You have trouble with your vision. SEEK IMMEDIATE MEDICAL CARE IF:  You develop a severe headache or confusion.  You have unusual weakness, numbness, or feel faint.  You have severe chest or abdominal pain.  You vomit repeatedly.  You have trouble breathing. MAKE SURE YOU:   Understand these instructions.  Will watch your condition.  Will get help right away if you are not doing well or get worse. Document Released: 12/12/2005 Document Revised: 04/28/2014 Document Reviewed: 10/04/2013 [facility] Patient Information 2015 [place], [place]. This information is not intended to replace advice given to you by your health care provider. Make sure you discuss any questions you have with your health care provider.

## 2014-09-15 NOTE — Progress Notes (Signed)
  Patient name: [facility]. MRN [id number]  Date of birth: [birth year]  CC & HPI:  [facility]. is a 33 y.o. male presenting today for HTN.   CHRONIC HYPERTENSION  BP Readings from Last 3 Encounters:  09/15/14 150/85  10/07/13 154/96  09/13/13 153/108    Disease Monitoring  Blood pressure range outside clinc: he checks his BP daily at home, but reports systolics 110 to 130s , diastolics always less than 90  Chest pain: no   Dyspnea: no   Claudication: no  Medication compliance/financial difficulties: no, he reports that his prescription for hydrochlorothiazide or not this summer and he hasn't taken it since  Medication Side Effects: Denies Dizziness/lightheadedness; cough, angioedema,lightheadedness, rash; Joint pain, sexual dysfunction or LE edema Preventitive Healthcare:   History  Smoking status  . Never Smoker   Smokeless tobacco  . Not on file    Salt Restriction < 1500 mg daily: Discussed - Currently eating fast food often  ROS: See HPI   Medical & Surgical Hx:  Reviewed  Medications & Allergies: Reviewed  Social History: Reviewed:   Objective Findings:  Vitals: BP 150/85  Pulse 78  Ht  (1.981 m)  Wt 203 lb (92.08 kg)  BMI 23.46 kg/m2  Gen: NAD CV: RRR w/o m/r/g, pulses +2 b/l Resp: CTAB w/ normal respiratory effort GI: No abdominal bruits  Assessment & Plan:   Please See Problem Focused Assessment & Plan

## 2014-09-23 ENCOUNTER — Other Ambulatory Visit: Payer: Self-pay | Admitting: Family Medicine

## 2014-12-23 ENCOUNTER — Telehealth: Payer: Self-pay | Admitting: Licensed Clinical Social Worker

## 2014-12-23 NOTE — Telephone Encounter (Signed)
Received new transfer referral from [facility], left message for patient to call and schedule an intake appointment.

## 2015-03-07 ENCOUNTER — Emergency Department (HOSPITAL_COMMUNITY)
Admission: EM | Admit: 2015-03-07 | Discharge: 2015-03-07 | Disposition: A | Discharge status: Home / Self Care | Payer: BC Managed Care – PPO | Attending: Emergency Medicine | Admitting: Emergency Medicine

## 2015-03-07 ENCOUNTER — Encounter (HOSPITAL_COMMUNITY): Payer: Self-pay | Admitting: Emergency Medicine

## 2015-03-07 DIAGNOSIS — I10 Essential (primary) hypertension: Secondary | ICD-10-CM | POA: Insufficient documentation

## 2015-03-07 DIAGNOSIS — Z79899 Other long term (current) drug therapy: Secondary | ICD-10-CM | POA: Insufficient documentation

## 2015-03-07 DIAGNOSIS — E876 Hypokalemia: Secondary | ICD-10-CM | POA: Diagnosis not present

## 2015-03-07 LAB — I-STAT CHEM 8, ED
BUN: 16 mg/dL (ref 6–23)
Calcium, Ion: 1.19 mmol/L (ref 1.12–1.23)
Chloride: 99 mmol/L (ref 96–112)
Creatinine, Ser: 1.3 mg/dL (ref 0.50–1.35)
Glucose, Bld: 180 mg/dL — ABNORMAL HIGH (ref 70–99)
HCT: 45 % (ref 39.0–52.0)
HEMOGLOBIN: 15.3 g/dL (ref 13.0–17.0)
Potassium: 3.1 mmol/L — ABNORMAL LOW (ref 3.5–5.1)
SODIUM: 140 mmol/L (ref 135–145)
TCO2: 22 mmol/L (ref 0–100)

## 2015-03-07 MED ORDER — POTASSIUM CHLORIDE CRYS ER 20 MEQ PO TBCR: 40.0000 meq | EXTENDED_RELEASE_TABLET | Freq: Once | ORAL | Status: DC

## 2015-03-07 MED ORDER — LISINOPRIL 10 MG PO TABS: 10.0000 mg | ORAL_TABLET | Freq: Every day | ORAL | Status: DC

## 2015-03-07 MED ORDER — POTASSIUM CHLORIDE CRYS ER 20 MEQ PO TBCR
40.0000 meq | EXTENDED_RELEASE_TABLET | Freq: Once | ORAL | Status: AC
  Administered 2015-03-07: 40 meq via ORAL
  Filled 2015-03-07: qty 2

## 2015-03-07 NOTE — ED Provider Notes (Signed)
CSN: [id number]     Arrival date & time 03/07/15  0612 History   First MD Initiated Contact with Patient 03/07/15 [phone]     Chief Complaint  Patient presents with  . Hypertension     (Consider location/radiation/quality/duration/timing/severity/associated sxs/prior Treatment) HPI Comments: The pt is a 34 y/o male who had previously been on 3 BP meds - he stopped at his doctors recommendation 6 months ago and continued only on HCTZ.  He reports doing well since that time until this AM - he slept well, awoke feeling restless and when he took his BP it was 190 / 103.  He had a normal pulse and no other c/o - Sx are mild, persistent, nothing makes better or worse.  No fevers, chills, headache, sore throat, visual changes, neck pain, back pain, chest pain, abdominal pain, shortness of breath, cough, dysuria, diarrhea, rectal bleeding, swelling, rashes, numbness or weakness.  He has not had his HCTZ this AM (usually takes in the morning)  Patient is a 34 y.o. male presenting with hypertension. The history is provided by the patient.  Hypertension    Past Medical History  Diagnosis Date  . Hypertension     diagnosed at an urgetnt care, but has nto taken regular medications   Past Surgical History  Procedure Laterality Date  . Vasectomy     Family History  Problem Relation Age of Onset  . Prostate cancer      grandfather  . Hypertension Mother   . Hypertension Father    History  Substance Use Topics  . Smoking status: Never Smoker   . Smokeless tobacco: Not on file  . Alcohol Use: 3.0 oz/week    6 drink(s) per week     Comment: may every few months have more than 6 beers on one occaion    Review of Systems  All other systems reviewed and are negative.     Allergies  Review of patient's allergies indicates no known allergies.  Home Medications   Prior to Admission medications   Medication Sig Start Date End Date Taking? Authorizing Provider  hydrochlorothiazide  (HYDRODIURIL) 25 MG tablet TAKE 1 TABLET BY MOUTH DAILY 09/15/14  Yes Jamal Collin, MD  lisinopril (PRINIVIL,ZESTRIL) 10 MG tablet Take 1 tablet (10 mg total) by mouth daily. 03/07/15   Eber Hong, MD   BP 143/96 mmHg  Pulse 93  Temp(Src) 97.9 F (36.6 C) (Oral)  Resp 22  Ht  (1.981 m)  Wt 215 lb (97.523 kg)  BMI 24.85 kg/m2  SpO2 97% Physical Exam  Constitutional: He appears well-developed and well-nourished. No distress.  HENT:  Head: Normocephalic and atraumatic.  Mouth/Throat: Oropharynx is clear and moist. No oropharyngeal exudate.  Eyes: Conjunctivae and EOM are normal. Pupils are equal, round, and reactive to light. Right eye exhibits no discharge. Left eye exhibits no discharge. No scleral icterus.  Neck: Normal range of motion. Neck supple. No JVD present. No thyromegaly present.  Cardiovascular: Normal rate, regular rhythm, normal heart sounds and intact distal pulses.  Exam reveals no gallop and no friction rub.   No murmur heard. Pulmonary/Chest: Effort normal and breath sounds normal. No respiratory distress. He has no wheezes. He has no rales.  Abdominal: Soft. Bowel sounds are normal. He exhibits no distension and no mass. There is no tenderness.  Musculoskeletal: Normal range of motion. He exhibits no edema or tenderness.  Lymphadenopathy:    He has no cervical adenopathy.  Neurological: He is alert. Coordination normal.  Skin: Skin is warm and dry. No rash noted. No erythema.  Psychiatric: He has a normal mood and affect. His behavior is normal.  Nursing note and vitals reviewed.   ED Course  Procedures (including critical care time) Labs Review Labs Reviewed  I-STAT CHEM 8, ED - Abnormal; Notable for the following:    Potassium 3.1 (*)    Glucose, Bld 180 (*)    All other components within normal limits    Imaging Review No results found.   EKG Interpretation   Date/Time:  Saturday March 07 2015 06:24:42 EST Ventricular Rate:  114 PR  Interval:    QRS Duration: 97 QT Interval:  349 QTC Calculation: 481 R Axis:   75 Text Interpretation:  Atrial fibrillation Borderline T abnormalities,  inferior leads Borderline prolonged QT interval Artifact in lead(s) I III  aVL Since last tracing rate faster and non specific T wave abnormalities  now present Confirmed by Avner Stroder  MD, Nat Lowenthal ([id number]) on 03/07/2015 7:11:06 AM      MDM   Final diagnoses:  Essential hypertension  Hypokalemia    Well appearing, mild elevation in BP - had been on 3 meds in past - will add back in lisinopril and pt will f/u this week for recheck of BP and take BP 2 hours after taking meds - record and share with his MD.  He appears well, has no other findings of concern clinically.  Check renal function and lytes prior to restarting lisinopril.  Hypokalemia - treated  Meds given in ED:  Medications  potassium chloride SA (K-DUR,KLOR-CON) CR tablet 40 mEq (not administered)    New Prescriptions   LISINOPRIL (PRINIVIL,ZESTRIL) 10 MG TABLET    Take 1 tablet (10 mg total) by mouth daily.         Eber HongBrian Mavric Cortright, MD 03/07/15 [phone]

## 2015-03-07 NOTE — ED Notes (Signed)
Pt arrives with c/o HTN stating 190 systolic upon waking up this morning, states he takes HCTZ. Denies headache, c/o SHOB, worse with ambulation but speaking in full sentences and ambulatory at triage.

## 2015-03-07 NOTE — Discharge Instructions (Signed)
Please call your doctor for a followup appointment within 24-48 hours. When you talk to your doctor please let them know that you were seen in the emergency department and have them acquire all of your records so that they can discuss the findings with you and formulate a treatment plan to fully care for your new and ongoing problems. ° °

## 2015-04-24 ENCOUNTER — Encounter (HOSPITAL_COMMUNITY): Payer: Self-pay | Admitting: Emergency Medicine

## 2015-04-24 ENCOUNTER — Emergency Department (HOSPITAL_COMMUNITY)
Admission: EM | Admit: 2015-04-24 | Discharge: 2015-04-25 | Disposition: A | Discharge status: Home / Self Care | Payer: BC Managed Care – PPO | Attending: Emergency Medicine | Admitting: Emergency Medicine

## 2015-04-24 ENCOUNTER — Emergency Department (HOSPITAL_COMMUNITY): Payer: BC Managed Care – PPO

## 2015-04-24 DIAGNOSIS — Z79899 Other long term (current) drug therapy: Secondary | ICD-10-CM | POA: Insufficient documentation

## 2015-04-24 DIAGNOSIS — I10 Essential (primary) hypertension: Secondary | ICD-10-CM | POA: Diagnosis not present

## 2015-04-24 DIAGNOSIS — J0100 Acute maxillary sinusitis, unspecified: Secondary | ICD-10-CM | POA: Insufficient documentation

## 2015-04-24 DIAGNOSIS — R51 Headache: Secondary | ICD-10-CM | POA: Diagnosis present

## 2015-04-24 NOTE — ED Provider Notes (Signed)
CSN: [id number]     Arrival date & time 04/24/15  2136 History   First MD Initiated Contact with Patient 04/24/15 2336     Chief Complaint  Patient presents with  . Hypertension  . Facial Pain  . Cough   HPI   34 year old male presents today with 8 days of sinus pressure, pain, dark nasal discharge. Patient reports that actually 10 days ago he started having a sore throat with postnasal drainage the develop in the sinus pressure that is continuing to worsen. Patient reports that he has a history of sinus infections stating this feels similar. He reports that usually the sinus pressure resolves after 5-6 days, but feels that this one continues to worsen. He has not tried any at-home therapies. Describes the pain as pressure with radiation induced teeth with forward head movements. She denies fever, headache, nausea, vomiting, productive cough, chest pain, or any other concerning signs or symptoms.   Past Medical History  Diagnosis Date  . Hypertension     diagnosed at an urgetnt care, but has nto taken regular medications   Past Surgical History  Procedure Laterality Date  . Vasectomy     Family History  Problem Relation Age of Onset  . Prostate cancer      grandfather  . Hypertension Mother   . Hypertension Father    History  Substance Use Topics  . Smoking status: Never Smoker   . Smokeless tobacco: Not on file  . Alcohol Use: 3.0 oz/week    6 drink(s) per week     Comment: may every few months have more than 6 beers on one occaion    Review of Systems  All other systems reviewed and are negative.   Allergies  Review of patient's allergies indicates no known allergies.  Home Medications   Prior to Admission medications   Medication Sig Start Date End Date Taking? Authorizing Provider  hydrochlorothiazide (HYDRODIURIL) 25 MG tablet TAKE 1 TABLET BY MOUTH DAILY 09/15/14   Jamal Collin, MD  lisinopril (PRINIVIL,ZESTRIL) 10 MG tablet Take 1 tablet (10 mg total) by  mouth daily. 03/07/15   Eber Hong, MD   BP 149/100 mmHg  Pulse 84  Temp(Src) 97.8 F (36.6 C) (Oral)  Resp 18  Ht  (1.981 m)  Wt 212 lb (96.163 kg)  BMI 24.50 kg/m2  SpO2 99% Physical Exam  Constitutional: He is oriented to person, place, and time. He appears well-developed and well-nourished.  HENT:  Head: Normocephalic and atraumatic.  Right Ear: Hearing and tympanic membrane normal. Tympanic membrane is not erythematous. No middle ear effusion.  Left Ear: Hearing and tympanic membrane normal. Tympanic membrane is not erythematous.  No middle ear effusion.  Nose: Mucosal edema and rhinorrhea present. Right sinus exhibits maxillary sinus tenderness.  Mouth/Throat: Uvula is midline, oropharynx is clear and moist and mucous membranes are normal. No oropharyngeal exudate, posterior oropharyngeal edema, posterior oropharyngeal erythema or tonsillar abscesses.  Tenderness to palpation of maxillary sinus right  Eyes: Conjunctivae are normal. Pupils are equal, round, and reactive to light. Right eye exhibits no discharge. Left eye exhibits no discharge. No scleral icterus.  Neck: Normal range of motion. No JVD present. No tracheal deviation present.  Cardiovascular: Normal rate, regular rhythm, normal heart sounds and intact distal pulses.   Pulmonary/Chest: Effort normal. No stridor.  Abdominal: Soft. There is no tenderness.  Neurological: He is alert and oriented to person, place, and time. Coordination normal.  Psychiatric: He has a normal mood and  affect. His behavior is normal. Judgment and thought content normal.  Nursing note and vitals reviewed.   ED Course  Procedures (including critical care time) Labs Review Labs Reviewed - No data to display  Imaging Review Dg Chest 2 View  04/24/2015   CLINICAL DATA:  Hypertension today, cough and congestion for 1 week. Intermittent chest pain.  EXAM: CHEST  2 VIEW  COMPARISON:  Chest radiograph August 18, 2013  FINDINGS:  Cardiomediastinal silhouette is unremarkable. The lungs are clear without pleural effusions or focal consolidations. Trachea projects midline and there is no pneumothorax. Soft tissue planes and included osseous structures are non-suspicious.  IMPRESSION: Normal chest.   Electronically Signed   By: Awilda Metroourtnay  Bloomer   On: 04/24/2015 23:16     EKG Interpretation None      MDM   Final diagnoses:  Acute maxillary sinusitis, recurrence not specified    Labs: None indicated.  Imaging: DG chest 2 view normal  Consults: None  Therapeutics: None  Assessment: Sinusitis  Plan: Patient with a history sinusitis presents today with similar signs and symptoms. Patient reports initial symptoms started approximately 10 days ago with sinus pressure 8 days. Based on his exam and time since onset of symptoms this likely represents bacterial sinusitis at this point. Patient was prescribed Augmentin, and instructed to use saline rinses. He is encouraged to monitor for worsening signs or symptoms and seek care with his primary care provider for further evaluation and management. [facility].      Eyvonne MechanicJeffrey Jonte Wollam, PA-C 04/25/15 [phone]  Linwood DibblesJon Knapp, MD 04/25/15 [phone]

## 2015-04-24 NOTE — ED Notes (Addendum)
C/o sinus pain, cough with brown sputum, and nasal congestion x 1 week.  States BP was elevated at home today.  PT has taken his BP medication today.

## 2015-04-25 MED ORDER — AMOXICILLIN-POT CLAVULANATE 875-125 MG PO TABS: 1.0000 | ORAL_TABLET | Freq: Two times a day (BID) | ORAL | Status: DC

## 2015-04-25 NOTE — Discharge Instructions (Signed)
Please use medication as directed, follow up with [facility] ecchymosis of symptoms do not improve. Monitor for worsening signs or symptoms and seek immediate care if symptoms worsen.

## 2015-05-18 ENCOUNTER — Ambulatory Visit (INDEPENDENT_AMBULATORY_CARE_PROVIDER_SITE_OTHER): Payer: BC Managed Care – PPO | Admitting: Family Medicine

## 2015-05-18 ENCOUNTER — Encounter: Payer: Self-pay | Admitting: Family Medicine

## 2015-05-18 VITALS — BP 142/91 | HR 124 | Temp 97.9°F | Wt 200.0 lb

## 2015-05-18 DIAGNOSIS — H6980 Other specified disorders of Eustachian tube, unspecified ear: Secondary | ICD-10-CM | POA: Insufficient documentation

## 2015-05-18 DIAGNOSIS — R000 Tachycardia, unspecified: Secondary | ICD-10-CM | POA: Insufficient documentation

## 2015-05-18 DIAGNOSIS — H6983 Other specified disorders of Eustachian tube, bilateral: Secondary | ICD-10-CM

## 2015-05-18 DIAGNOSIS — I10 Essential (primary) hypertension: Secondary | ICD-10-CM

## 2015-05-18 DIAGNOSIS — H6990 Unspecified Eustachian tube disorder, unspecified ear: Secondary | ICD-10-CM | POA: Insufficient documentation

## 2015-05-18 MED ORDER — METOPROLOL SUCCINATE ER 25 MG PO TB24: 25.0000 mg | ORAL_TABLET | Freq: Every day | ORAL | Status: DC

## 2015-05-18 NOTE — Assessment & Plan Note (Signed)
Eustachian tube dysfunction after acute sinusitis Discussed starting Zyrtec, also encouraged Neti pot

## 2015-05-18 NOTE — Patient Instructions (Signed)
Great to meet you!  Take metoprolol instead of lisinopril Lets see you back in the next 2-3 weeks You should hear from cardiology soon as well  Nonspecific Tachycardia Tachycardia is a faster than normal heartbeat (more than 100 beats per minute). In adults, the heart normally beats between 60 and 100 times a minute. A fast heartbeat may be a normal response to exercise or stress. It does not necessarily mean that something is wrong. However, sometimes when your heart beats too fast it may not be able to pump enough blood to the rest of your body. This can result in chest pain, shortness of breath, dizziness, and even fainting. Nonspecific tachycardia means that the specific cause or pattern of your tachycardia is unknown. CAUSES  Tachycardia may be harmless or it may be due to a more serious underlying cause. Possible causes of tachycardia include:  Exercise or exertion.  Fever.  Pain or injury.  Infection.  Loss of body fluids (dehydration).  Overactive thyroid.  Lack of red blood cells (anemia).  Anxiety and stress.  Alcohol.  Caffeine.  Tobacco products.  Diet pills.  Illegal drugs.  Heart disease. SYMPTOMS  Rapid or irregular heartbeat (palpitations).  Suddenly feeling your heart beating (cardiac awareness).  Dizziness.  Tiredness (fatigue).  Shortness of breath.  Chest pain.  Nausea.  Fainting. DIAGNOSIS  Your caregiver will perform a physical exam and take your medical history. In some cases, a heart specialist (cardiologist) may be consulted. Your caregiver may also order:  Blood tests.  Electrocardiography. This test records the electrical activity of your heart.  A heart monitoring test. TREATMENT  Treatment will depend on the likely cause of your tachycardia. The goal is to treat the underlying cause of your tachycardia. Treatment methods may include:  Replacement of fluids or blood through an intravenous (IV) tube for moderate to severe  dehydration or anemia.  New medicines or changes in your current medicines.  Diet and lifestyle changes.  Treatment for certain infections.  Stress relief or relaxation methods. HOME CARE INSTRUCTIONS   Rest.  Drink enough fluids to keep your urine clear or pale yellow.  Do not smoke.  Avoid:  Caffeine.  Tobacco.  Alcohol.  Chocolate.  Stimulants such as over-the-counter diet pills or pills that help you stay awake.  Situations that cause anxiety or stress.  Illegal drugs such as marijuana, phencyclidine (PCP), and cocaine.  Only take medicine as directed by your caregiver.  Keep all follow-up appointments as directed by your caregiver. SEEK IMMEDIATE MEDICAL CARE IF:   You have pain in your chest, upper arms, jaw, or neck.  You become weak, dizzy, or feel faint.  You have palpitations that will not go away.  You vomit, have diarrhea, or pass blood in your stool.  Your skin is cool, pale, and wet.  You have a fever that will not go away with rest, fluids, and medicine. MAKE SURE YOU:   Understand these instructions.  Will watch your condition.  Will get help right away if you are not doing well or get worse. Document Released: 01/19/2005 Document Revised: 03/05/2012 Document Reviewed: 11/22/2011 [facility] Patient Information 2015 [employer] [place]. This information is not intended to replace advice given to you by your health care provider. Make sure you discuss any questions you have with your health care provider.

## 2015-05-18 NOTE — Assessment & Plan Note (Signed)
Blood pressure nearly controlled today without lisinopril. Concerned as he has persistent tachycardia, EKG performed today with sinus tachycardia meet criteria for LVH. Discussed with him options of repeating echo again versus sending him to cardiology for thorough evaluation. He is okay with cardiology Labs Continue HCTZ, discontinue lisinopril, start metoprolol Follow-up 2-3 weeks, titrate metoprolol if needed

## 2015-05-18 NOTE — Assessment & Plan Note (Signed)
Tachycardia today, on review of records he's had a few episodes of tachycardia EKG shows sinus tachycardia with LVH criteria Starting metoprolol instead of lisinopril to help control heart rate Red flags provided, f/u 2-3 weeks

## 2015-05-18 NOTE — Progress Notes (Signed)
Patient ID: Jonathan Nolan, male   DOB: 02-Apr-1981, 34 y.o.   MRN: [id number]   HPI  Patient presents today for same-day appointment for ear pain and hypertension  Ear pain Patient's when she's getting over acute sinusitis, he finished his antibiotics. He's having ear popping symptoms and discomfort with walking from cold to warm areas. He's not been using any antihistamines. He is hearing normally. He denies fever, chills, change in appetite. He endorses symptoms of postnasal drip, congestion  HTN  previously well-controlled with lisinopril and HCTZ, now out of lisinopril for 3 days. Notes racing heart occasionally, never with weakness, loss of consciousness, or lightheadedness. Denies chest pain, dyspnea, and headache. States that since she's been out of his lisinopril he hasn't felt quite right but no specific symptoms. He's felt his heart racing several times and has been a little bit concerned about it.  PMH: Smoking status noted - never smoker ROS: Per HPI  Objective: BP 142/91 mmHg  Pulse 124  Temp(Src) 97.9 F (36.6 C) (Oral)  Wt 200 lb (90.719 kg) Gen: NAD, alert, cooperative with exam HEENT: NCAT ,TMs WNL BL, MMM, nares swollen bilaterally CV: RRR, good S1/S2, no murmur Resp: CTABL, no wheezes, non-labored Ext: No edema, warm Neuro: Alert and oriented, No gross deficits  Assessment and plan:  Eustachian tube dysfunction Eustachian tube dysfunction after acute sinusitis Discussed starting Zyrtec, also encouraged Neti pot    Hypertension Blood pressure nearly controlled today without lisinopril. Concerned as he has persistent tachycardia, EKG performed today with sinus tachycardia meet criteria for LVH. Discussed with him options of repeating echo again versus sending him to cardiology for thorough evaluation. He is okay with cardiology Labs Continue HCTZ, discontinue lisinopril, start metoprolol Follow-up 2-3 weeks, titrate metoprolol if  needed   Tachycardia Tachycardia today, on review of records he's had a few episodes of tachycardia EKG shows sinus tachycardia with LVH criteria Starting metoprolol instead of lisinopril to help control heart rate Red flags provided, f/u 2-3 weeks     Orders Placed This Encounter  Procedures  . CBC with Differential    Standing Status: Future     Number of Occurrences:      Standing Expiration Date: 05/17/2016  . TSH    Standing Status: Future     Number of Occurrences:      Standing Expiration Date: 05/17/2016  . Comprehensive metabolic panel    Standing Status: Future     Number of Occurrences:      Standing Expiration Date: 05/17/2016  . Ambulatory referral to Cardiology    Referral Priority:  Routine    Referral Type:  Consultation    Referral Reason:  Specialty Services Required    Requested Specialty:  Cardiology    Number of Visits Requested:  1  . EKG 12-Lead    Meds ordered this encounter  Medications  . metoprolol succinate (TOPROL-XL) 25 MG 24 hr tablet    Sig: Take 1 tablet (25 mg total) by mouth daily.    Dispense:  30 tablet    Refill:  3

## 2015-05-27 ENCOUNTER — Other Ambulatory Visit: Payer: BC Managed Care – PPO

## 2015-05-27 DIAGNOSIS — I10 Essential (primary) hypertension: Secondary | ICD-10-CM

## 2015-05-27 NOTE — Progress Notes (Signed)
CMP,CBC WITH DIFF AND TSH DONE TODAY Jonathan Nolan

## 2015-05-28 LAB — COMPREHENSIVE METABOLIC PANEL
ALBUMIN: 4.2 g/dL (ref 3.5–5.2)
ALK PHOS: 50 U/L (ref 39–117)
ALT: 45 U/L (ref 0–53)
AST: 19 U/L (ref 0–37)
BILIRUBIN TOTAL: 0.2 mg/dL (ref 0.2–1.2)
BUN: 10 mg/dL (ref 6–23)
CALCIUM: 9.4 mg/dL (ref 8.4–10.5)
CHLORIDE: 104 meq/L (ref 96–112)
CO2: 24 mEq/L (ref 19–32)
CREATININE: 1.02 mg/dL (ref 0.50–1.35)
GLUCOSE: 109 mg/dL — AB (ref 70–99)
Potassium: 3.8 mEq/L (ref 3.5–5.3)
Sodium: 140 mEq/L (ref 135–145)
Total Protein: 6.6 g/dL (ref 6.0–8.3)

## 2015-05-28 LAB — CBC WITH DIFFERENTIAL/PLATELET
Basophils Absolute: 0.1 10*3/uL (ref 0.0–0.1)
Basophils Relative: 1 % (ref 0–1)
Eosinophils Absolute: 0.1 10*3/uL (ref 0.0–0.7)
Eosinophils Relative: 2 % (ref 0–5)
HEMATOCRIT: 39.1 % (ref 39.0–52.0)
Hemoglobin: 12.8 g/dL — ABNORMAL LOW (ref 13.0–17.0)
Lymphocytes Relative: 42 % (ref 12–46)
Lymphs Abs: 2.8 10*3/uL (ref 0.7–4.0)
MCH: 28.7 pg (ref 26.0–34.0)
MCHC: 32.7 g/dL (ref 30.0–36.0)
MCV: 87.7 fL (ref 78.0–100.0)
MONOS PCT: 10 % (ref 3–12)
MPV: 9.5 fL (ref 8.6–12.4)
Monocytes Absolute: 0.7 10*3/uL (ref 0.1–1.0)
Neutro Abs: 3 10*3/uL (ref 1.7–7.7)
Neutrophils Relative %: 45 % (ref 43–77)
Platelets: 388 10*3/uL (ref 150–400)
RBC: 4.46 MIL/uL (ref 4.22–5.81)
RDW: 14.1 % (ref 11.5–15.5)
WBC: 6.7 10*3/uL (ref 4.0–10.5)

## 2015-05-28 LAB — TSH: TSH: 0.801 u[IU]/mL (ref 0.350–4.500)

## 2015-05-29 ENCOUNTER — Encounter: Payer: Self-pay | Admitting: Family Medicine

## 2015-06-15 ENCOUNTER — Ambulatory Visit (INDEPENDENT_AMBULATORY_CARE_PROVIDER_SITE_OTHER): Payer: BC Managed Care – PPO | Admitting: Family Medicine

## 2015-06-15 ENCOUNTER — Encounter: Payer: Self-pay | Admitting: Family Medicine

## 2015-06-15 VITALS — BP 162/94 | HR 95 | Temp 98.1°F | Ht 78.0 in

## 2015-06-15 DIAGNOSIS — I10 Essential (primary) hypertension: Secondary | ICD-10-CM | POA: Diagnosis not present

## 2015-06-15 NOTE — Assessment & Plan Note (Signed)
Blood pressure well controlled by home readings but elevated in office today, likely due to whitecoat hypertension - Given LVH on EKG - echo ordered - Sleep study also ordered due to symptoms of OSA, and current HTN with report of previous episode of Afib - Tolerating hydrochlorothiazide and metoprolol well; however, experiencing some occasional erectile dysfunction - he wishes to continue with these medications at this time - Has appointment with cardiology scheduled in 2 weeks

## 2015-06-15 NOTE — Progress Notes (Signed)
  Patient name: Jonathan Nolan MRN [id number]  Date of birth: 07/26/1981  CC & HPI:  Jonathan Nolan is a 34 y.o. male presenting today for htn.   CHRONIC HYPERTENSION  BP Readings from Last 3 Encounters:  06/15/15 162/94  05/18/15 142/91  04/25/15 149/100    Denies family history of early cardiac pathology.  Denies excessive alcohol consumption or illicit drug use.  Reports chronic snoring at night, but denies daytime somnolence.   Disease Monitoring  Blood pressure range outside clinc: reports daily reading on two monitor (120-130/70-80s)  Chest pain: no   Dyspnea: no   Claudication: no  Medication compliance: yes  Medication Side Effects: denis Dizziness/lightheadedness; Fatigue  Reports some sexual dysfunction Preventitive Healthcare:   History  Smoking status  . Never Smoker   Smokeless tobacco  . Not on file   ROS: See HPI   Medications & Allergies: Reviewed  Social History: Reviewed:   Objective Findings:  Vitals: BP 162/94 mmHg  Pulse 95  Temp(Src) 98.1 F (36.7 C) (Oral)  Ht 6\' 6"  (1.981 m)  Gen: NAD HEENT: Thyroid nonpalpable CV: RRR w/o m/r/g, pulses +2 b/l; no lower extremity edema Resp: CTAB w/ normal respiratory effort  Assessment & Plan:   Please See Problem Focused Assessment & Plan

## 2015-06-17 ENCOUNTER — Telehealth: Payer: Self-pay | Admitting: Family Medicine

## 2015-06-17 ENCOUNTER — Ambulatory Visit (HOSPITAL_COMMUNITY)
Admission: RE | Admit: 2015-06-17 | Discharge: 2015-06-17 | Disposition: A | Discharge status: Home / Self Care | Payer: BC Managed Care – PPO | Source: Ambulatory Visit | Attending: Family Medicine | Admitting: Family Medicine

## 2015-06-17 DIAGNOSIS — I10 Essential (primary) hypertension: Secondary | ICD-10-CM

## 2015-06-17 NOTE — Progress Notes (Signed)
Echocardiogram 2D Echocardiogram has been performed.  Nolon Rod 06/17/2015, 9:51 AM

## 2015-06-17 NOTE — Telephone Encounter (Signed)
Called and informed patient of his normal Echo results. He has sleep study scheduled for 09/03/15. I advised him to make PCP in 2-3 weeks after sleep study.

## 2015-07-02 ENCOUNTER — Ambulatory Visit (INDEPENDENT_AMBULATORY_CARE_PROVIDER_SITE_OTHER): Payer: BC Managed Care – PPO | Admitting: Internal Medicine

## 2015-07-02 ENCOUNTER — Encounter: Payer: Self-pay | Admitting: Internal Medicine

## 2015-07-02 VITALS — BP 162/104 | HR 106 | Ht 78.0 in | Wt 212.0 lb

## 2015-07-02 DIAGNOSIS — I10 Essential (primary) hypertension: Secondary | ICD-10-CM

## 2015-07-02 NOTE — Patient Instructions (Signed)
Your physician recommends that you continue on your current medications as directed. Please refer to the Current Medication list given to you today. Your physician recommends that you schedule a follow-up appointment.  COME TO NURSE VISIT FOR BLOOD PRESSURE CHECK ON 07/13/15 AT 2:00 PM.  BRING YOUR HOME BP MACHINE WITH YOU AS WELL AS A LIST OF BLOOD PRESSURES.

## 2015-07-02 NOTE — Progress Notes (Signed)
Cardiology Office Note   Date:  07/02/2015   ID:  Jonathan DockerCharles Nolan, DOB July 27, 1981, MRN [id number]  PCP:  Wenda LowJames Joyner, MD  Cardiologist:   Dietrich PatesPaula Lyndee Herbst, MD   No chief complaint on file.  Ptient presents for evaluation of HTN     History of Present Illness: Jonathan Nolan is a 34 y.o. male with a history of HTN    Started on meds in 2013  FOllowed by Dr Gayla DossJoyner  BP in clinic higher than at home  He has never had his own cuff checked to see if accurate   Echo done on 6/22 showed LVEF normal  LV thicknes was normal  The patient denies CP  Breathing is OK  Snores a lot but signif other says she does not hear him stop breathing  The patient's BP has gone up at times at home  180/ at highest  These are usually with mental stress  Went to ER for this          Current Outpatient Prescriptions  Medication Sig Dispense Refill  . hydrochlorothiazide (HYDRODIURIL) 25 MG tablet TAKE 1 TABLET BY MOUTH DAILY 90 tablet 3  . metoprolol succinate (TOPROL-XL) 25 MG 24 hr tablet Take 1 tablet (25 mg total) by mouth daily. 30 tablet 3  . Multiple Vitamin (MULTIVITAMIN) tablet Take 1 tablet by mouth daily.    . [DISCONTINUED] amLODipine (NORVASC) 10 MG tablet Take 1 tablet (10 mg total) by mouth daily. (Patient not taking: Reported on 03/07/2015) 30 tablet 11   No current facility-administered medications for this visit.    Allergies:   Review of patient's allergies indicates no known allergies.   Past Medical History  Diagnosis Date  . Hypertension     diagnosed at an urgetnt care, but has nto taken regular medications    Past Surgical History  Procedure Laterality Date  . Vasectomy       Social History:  The patient  reports that he has never smoked. He does not have any smokeless tobacco history on file. He reports that he drinks about 3.0 oz of alcohol per week. He reports that he does not use illicit drugs.   Family History:  The patient's family history includes Hypertension in his  father and mother; Prostate cancer in an other family member.    ROS:  Please see the history of present illness. All other systems are reviewed and  Negative to the above problem except as noted.    PHYSICAL EXAM: VS:  BP 162/104 mmHg  Pulse 106  Ht 6\' 6"  (1.981 m)  Wt 212 lb (96.163 kg)  BMI 24.50 kg/m2  SpO2 99%  GEN: Well nourished, well developed, in no acute distress HEENT: normal Neck: no JVD, carotid bruits, or masses Cardiac: RRR; no murmurs, rubs, or gallops,no edema  Respiratory:  clear to auscultation bilaterally, normal work of breathing GI: soft, nontender, nondistended, + BS  No hepatomegaly  MS: no deformity Moving all extremities   Skin: warm and dry, no rash Neuro:  Strength and sensation are intact Psych: euthymic mood, full affect   EKG:  EKG is not ordered today  05/18/15:  ST 106 bpm  LAE  LVH.   Lipid Panel    Component Value Date/Time   CHOL 189 10/07/2013 1059   TRIG 107 10/07/2013 1059   HDL 53 10/07/2013 1059   CHOLHDL 3.6 10/07/2013 1059   VLDL 21 10/07/2013 1059   LDLCALC 115* 10/07/2013 1059      Wt  Readings from Last 3 Encounters:  07/02/15 212 lb (96.163 kg)  05/18/15 200 lb (90.719 kg)  04/24/15 212 lb (96.163 kg)      ASSESSMENT AND PLAN:  1.  HTN  BP is high in clinic  ON my check it remains high  160/102 bilateral arms I hav reviewed echo and LV thickness is normal, chamber sizes are normal I would recomm that the pt bring his cuff in to clinic in the next few wks to see what his readings are with his and with ours. No changes for now. Agee with sleep testing though his girlfriend denies apnea I am not convinced meds associated with decreased libido.   I would not make changes    2.  HCM  Encouraged him to stay active Lipids OK in 2014    Signed, Dietrich Pates, MD  07/02/2015 5:46 PM    [facility] [address] [place], [place], [place]  [address] Phone: [phone]; Fax: [phone]

## 2015-08-04 ENCOUNTER — Ambulatory Visit (INDEPENDENT_AMBULATORY_CARE_PROVIDER_SITE_OTHER): Payer: BC Managed Care – PPO | Admitting: *Deleted

## 2015-08-04 VITALS — BP 165/92 | HR 107 | Ht 78.0 in | Wt 214.2 lb

## 2015-08-04 DIAGNOSIS — I10 Essential (primary) hypertension: Secondary | ICD-10-CM | POA: Diagnosis not present

## 2015-08-04 MED ORDER — METOPROLOL SUCCINATE ER 50 MG PO TB24: 50.0000 mg | ORAL_TABLET | Freq: Every day | ORAL | Status: DC

## 2015-08-04 NOTE — Progress Notes (Signed)
1.) Reason for visit: BP check, of brought  his own BP machine 167/109 HR 110 beats/minute to compare with BP machine in the office 165/92, HR 107 beats/minute.  2.) Name of MD requesting visit: Dietrich Pates MD  3.) H&P: Pt has a history of hypertension  4.) ROS related to problem: Heart rate 107 to 110 beats/minute  5.) Assessment and plan per MD: Dr Katrinka Blazing DOD aware , he recommends to increased his Metoprolol succinate to 50 mg once daily. Pt to f/U with Dr. Tenny Craw. Pt is aware of MD's recommendations. A prescription for Metoprolol Succinate 50 mg once daily sent to pt's pharmacy.

## 2015-08-04 NOTE — Patient Instructions (Signed)
Pt is aware to increased Metoprolol succinate from 25 mg to 50 mg once daily. Pt is aware of prescription sent to [facility] pharmacy. Pt is aware that if HR and BP is still up to call Dr. Tenny Craw for recommendations.

## 2015-08-19 ENCOUNTER — Telehealth: Payer: Self-pay | Admitting: Internal Medicine

## 2015-08-19 NOTE — Telephone Encounter (Signed)
Reviewed Jonathan Nolan's in-basket (CC'ed charts). Dr. Tenny Craw reviewed the patient's nurse visit from 08/04/15 and wanted the patient to follow up in 3-5 weeks. I left a message for the patient to call to schedule follow up.

## 2015-09-03 ENCOUNTER — Ambulatory Visit (HOSPITAL_BASED_OUTPATIENT_CLINIC_OR_DEPARTMENT_OTHER): Payer: Self-pay

## 2015-11-02 ENCOUNTER — Ambulatory Visit: Payer: BC Managed Care – PPO | Admitting: Internal Medicine

## 2015-11-22 ENCOUNTER — Ambulatory Visit (HOSPITAL_BASED_OUTPATIENT_CLINIC_OR_DEPARTMENT_OTHER): Payer: BC Managed Care – PPO

## 2015-11-26 ENCOUNTER — Other Ambulatory Visit: Payer: Self-pay | Admitting: Family Medicine

## 2015-11-26 ENCOUNTER — Ambulatory Visit: Payer: BC Managed Care – PPO | Admitting: Internal Medicine

## 2015-11-29 ENCOUNTER — Ambulatory Visit (HOSPITAL_BASED_OUTPATIENT_CLINIC_OR_DEPARTMENT_OTHER): Payer: BC Managed Care – PPO | Attending: Family Medicine

## 2015-11-30 NOTE — Telephone Encounter (Signed)
2nd request.  Martin, Tamika L, RN  

## 2016-01-20 ENCOUNTER — Encounter: Payer: Self-pay | Admitting: *Deleted

## 2016-02-01 ENCOUNTER — Ambulatory Visit: Payer: BC Managed Care – PPO | Admitting: Internal Medicine

## 2016-02-20 IMAGING — DX DG CHEST 2V
2 series · 2 of 2 positions shown · non-contrast
Comparison: Chest radiograph August 18, 2013

CLINICAL DATA: Hypertension today, cough and congestion for 1 week.
Intermittent chest pain.

EXAM:
CHEST  2 VIEW

[chest pa]
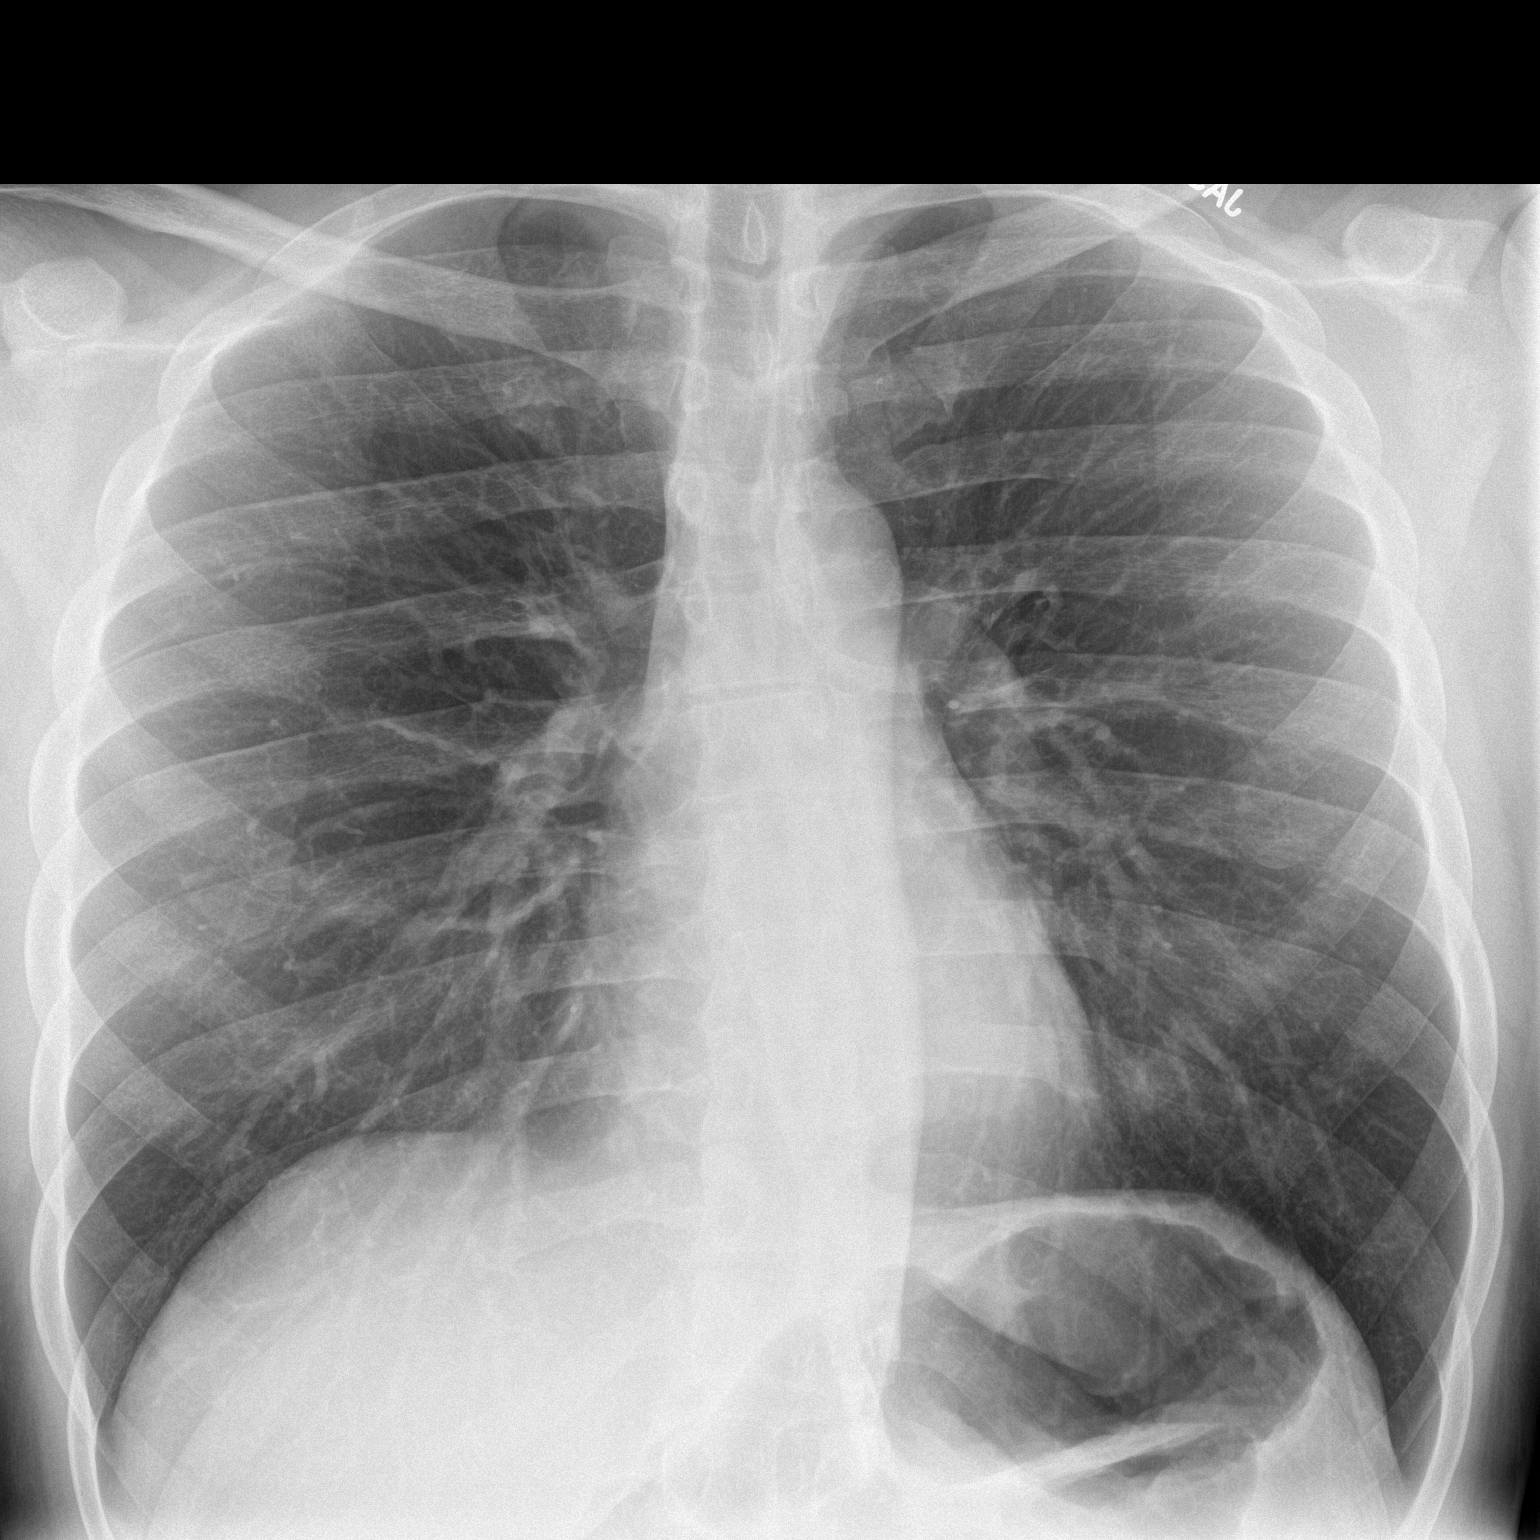

[chest lat]
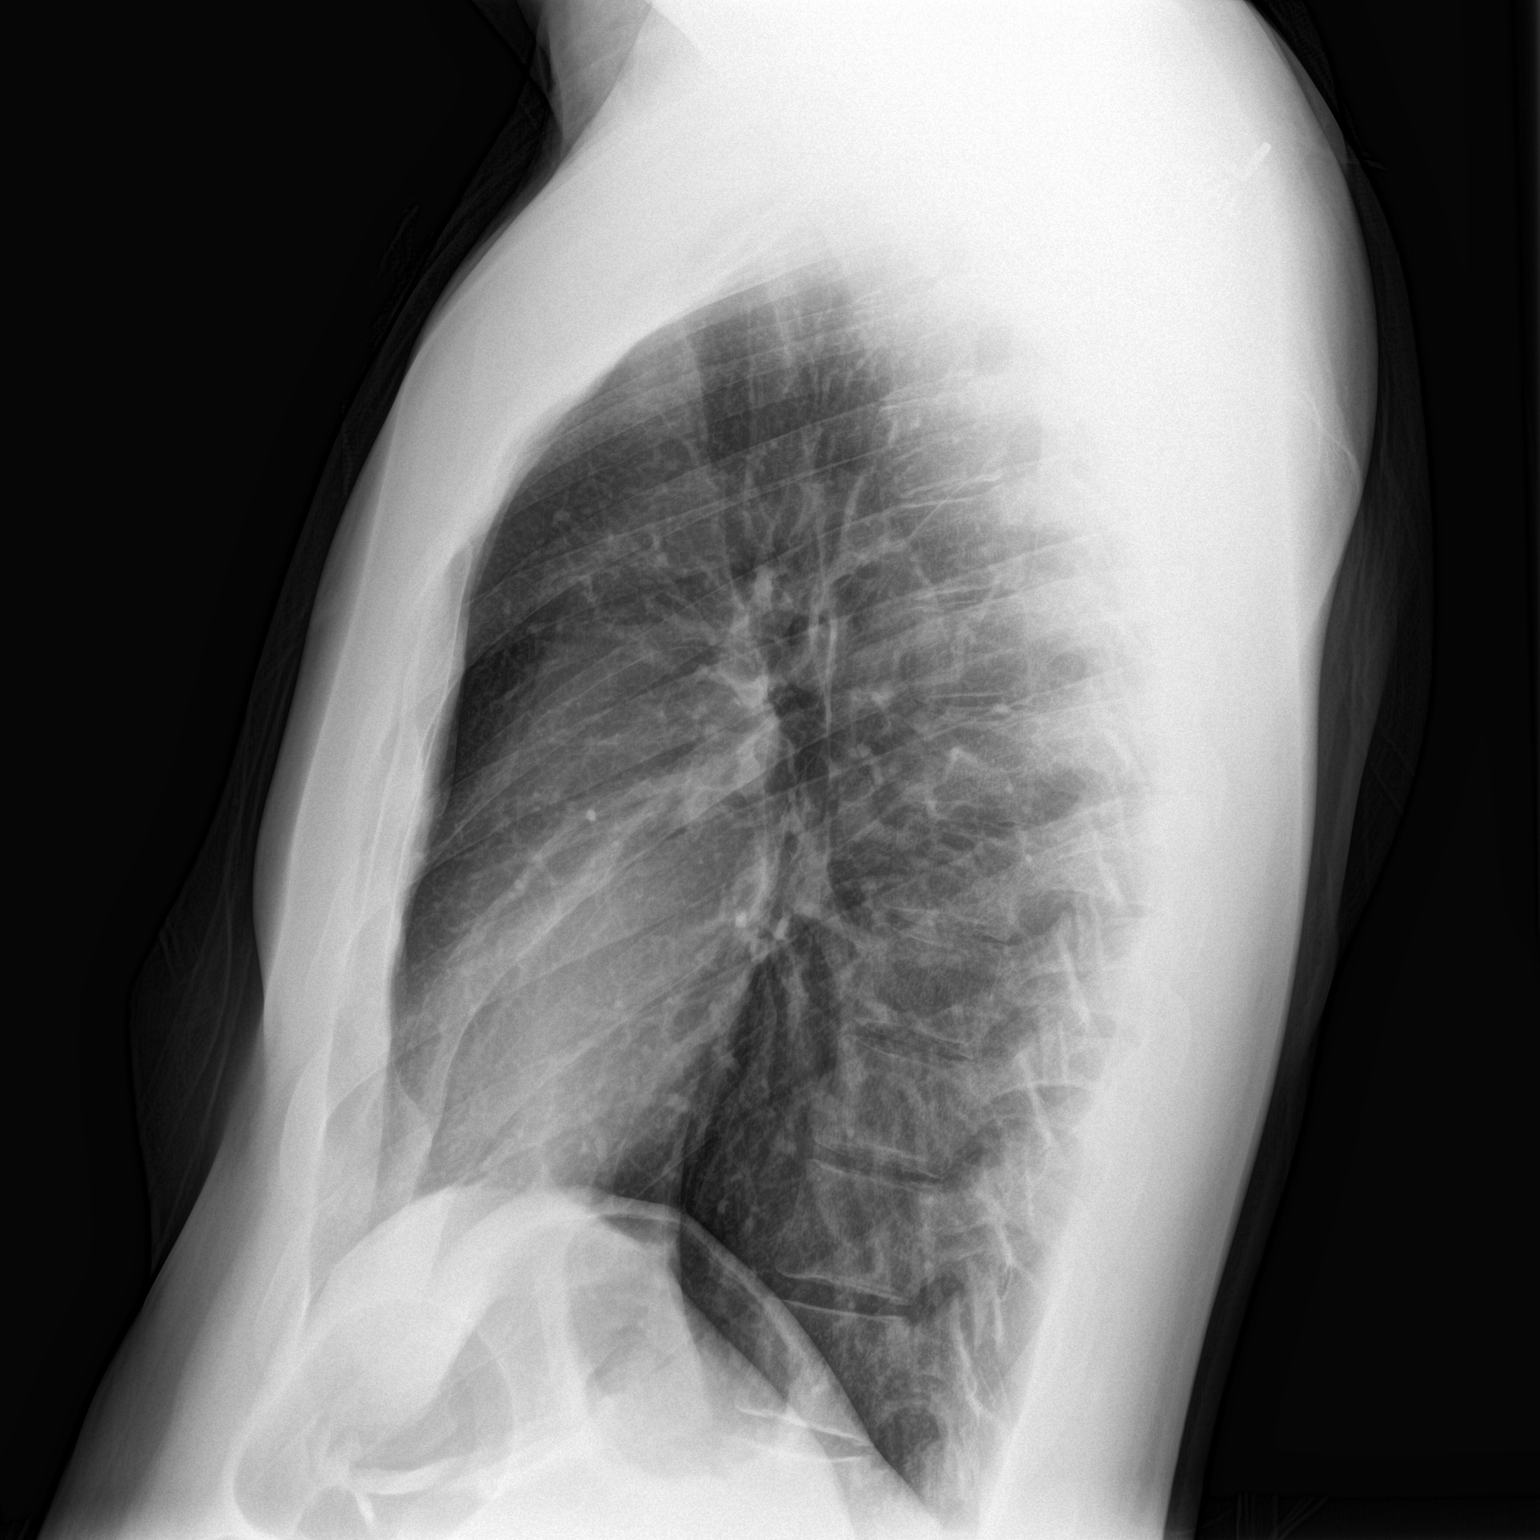

[2 of 2 positions shown; findings below may reference images not displayed]

FINDINGS: Cardiomediastinal silhouette is unremarkable. The lungs are clear
without pleural effusions or focal consolidations. Trachea projects
midline and there is no pneumothorax. Soft tissue planes and
included osseous structures are non-suspicious.
IMPRESSION: Normal chest.

By: Lyuba Alcaide

## 2016-02-23 ENCOUNTER — Other Ambulatory Visit: Payer: Self-pay | Admitting: Family Medicine

## 2016-02-24 NOTE — Telephone Encounter (Signed)
Please call. Needs appointment to discuss blood pressure. I have refilled medication for 2 months but needs to be seen prior to additional refills.

## 2016-02-25 NOTE — Telephone Encounter (Signed)
Patient is aware and will call back tomorrow. Mazi Brailsford,CMA

## 2016-02-26 ENCOUNTER — Ambulatory Visit: Payer: BC Managed Care – PPO | Admitting: Internal Medicine

## 2016-03-31 ENCOUNTER — Ambulatory Visit: Payer: BC Managed Care – PPO | Admitting: Internal Medicine

## 2016-04-22 ENCOUNTER — Ambulatory Visit: Payer: BC Managed Care – PPO | Admitting: Internal Medicine

## 2016-05-10 ENCOUNTER — Other Ambulatory Visit: Payer: Self-pay | Admitting: Interventional Cardiology

## 2016-06-17 ENCOUNTER — Ambulatory Visit: Payer: BC Managed Care – PPO | Admitting: Internal Medicine

## 2016-06-22 ENCOUNTER — Other Ambulatory Visit: Payer: Self-pay | Admitting: Family Medicine

## 2016-07-29 ENCOUNTER — Other Ambulatory Visit: Payer: Self-pay | Admitting: Family Medicine

## 2016-08-28 ENCOUNTER — Other Ambulatory Visit: Payer: Self-pay | Admitting: Family Medicine

## 2016-09-23 ENCOUNTER — Ambulatory Visit: Payer: BC Managed Care – PPO | Admitting: Internal Medicine

## 2016-09-25 NOTE — Progress Notes (Signed)
   Cardiology Office Note   Date:  09/26/2016   ID:  Jonathan Dockerharles Nolan, DOB [birth year], MRN [id number]  PCP:  Hilton SinclairKaty D Mayo, MD  Cardiologist:   Dietrich PatesPaula Alvah Lagrow, MD   F?U of HTN   History of Present Illness: Jonathan DockerCharles Sukhu is a 35 y.o. male with a history of HTN  I saw him in July 2016  Echo normal   BP has been running good  SOmetimes high on weeken d  150/95   During week comes down to 120s/70s       Outpatient Medications Prior to Visit  Medication Sig Dispense Refill  . hydrochlorothiazide (HYDRODIURIL) 25 MG tablet TAKE 1 TABLET BY MOUTH EVERY DAY 32 tablet 0  . metoprolol succinate (TOPROL-XL) 50 MG 24 hr tablet TAKE 1 TABLET(50 MG) BY MOUTH DAILY 90 tablet 0  . Multiple Vitamin (MULTIVITAMIN) tablet Take 1 tablet by mouth daily.     No facility-administered medications prior to visit.      Allergies:   Review of patient's allergies indicates no known allergies.   Past Medical History:  Diagnosis Date  . Hypertension    diagnosed at an urgetnt care, but has nto taken regular medications    Past Surgical History:  Procedure Laterality Date  . VASECTOMY       Social History:  The patient  reports that he has never smoked. He has never used smokeless tobacco. He reports that he drinks about 3.0 oz of alcohol per week . He reports that he does not use drugs.   Family History:  The patient's family history includes Hypertension in his father and mother.    ROS:  Please see the history of present illness. All other systems are reviewed and  Negative to the above problem except as noted.    PHYSICAL EXAM: VS:  BP (!) 168/104   Pulse (!) 104   Ht 6\' 6"  (1.981 m)   Wt 201 lb (91.2 kg)   BMI 23.23 kg/m   GEN: Well nourished, well developed, in no acute distress HEENT: normal Neck: no JVD, carotid bruits, or masses Cardiac: RRR; no murmurs, rubs, or gallops,no edema  Respiratory:  clear to auscultation bilaterally, normal work of breathing GI: soft, nontender,  nondistended, + BS  No hepatomegaly  MS: no deformity Moving all extremities   Skin: warm and dry, no rash Neuro:  Strength and sensation are intact Psych: euthymic mood, full affect   EKG:  EKG is ordered today.  ST 104 bpm     Lipid Panel    Component Value Date/Time   CHOL 189 10/07/2013 1059   TRIG 107 10/07/2013 1059   HDL 53 10/07/2013 1059   CHOLHDL 3.6 10/07/2013 1059   VLDL 21 10/07/2013 1059   LDLCALC 115 (H) 10/07/2013 1059      Wt Readings from Last 3 Encounters:  09/26/16 201 lb (91.2 kg)  08/04/15 214 lb 4 oz (97.2 kg)  07/02/15 212 lb (96.2 kg)      ASSESSMENT AND PLAN:  1  HTN  Patient says that BP is better at home  Still, up on weekends I would recomm pt stop toprol and start Cardiazem   180  Continue to check BP  Keep on HCTZ  Check BMET  I F/u in 1 year   Signed, Dietrich PatesPaula Avondre Richens, MD  09/26/2016 8:40 AM    [facility] [address], [place], [place]  [address] Phone: [phone]; Fax: [phone]

## 2016-09-26 ENCOUNTER — Encounter: Payer: Self-pay | Admitting: Internal Medicine

## 2016-09-26 ENCOUNTER — Ambulatory Visit (INDEPENDENT_AMBULATORY_CARE_PROVIDER_SITE_OTHER): Payer: BC Managed Care – PPO | Admitting: Internal Medicine

## 2016-09-26 VITALS — BP 168/104 | HR 104 | Ht 78.0 in | Wt 201.0 lb

## 2016-09-26 DIAGNOSIS — I10 Essential (primary) hypertension: Secondary | ICD-10-CM

## 2016-09-26 LAB — BASIC METABOLIC PANEL
BUN: 11 mg/dL (ref 7–25)
CO2: 29 mmol/L (ref 20–31)
CREATININE: 1.09 mg/dL (ref 0.60–1.35)
Calcium: 9.6 mg/dL (ref 8.6–10.3)
Chloride: 101 mmol/L (ref 98–110)
Glucose, Bld: 128 mg/dL — ABNORMAL HIGH (ref 65–99)
Potassium: 3.6 mmol/L (ref 3.5–5.3)
SODIUM: 138 mmol/L (ref 135–146)

## 2016-09-26 LAB — TSH: TSH: 0.98 m[IU]/L (ref 0.40–4.50)

## 2016-09-26 MED ORDER — DILTIAZEM HCL ER COATED BEADS 180 MG PO CP24: 180.0000 mg | ORAL_CAPSULE | Freq: Every day | ORAL | 3 refills | Status: DC

## 2016-09-26 NOTE — Patient Instructions (Signed)
Your physician has recommended you make the following change in your medication:  1.) STOP TOPROL 2.) START DILTIAZEM 180 MG ONCE A DAY  Your physician recommends that you return for lab work TODAY (BMET, TSH)  Your physician wants you to follow-up in: 1 YEAR WITH DR. Tenny CrawOSS.  You will receive a reminder letter in the mail two months in advance. If you don't receive a letter, please call our office to schedule the follow-up appointment.  PLEASE USE YOUR MYCHART PATIENT PORTAL TO SEND YOUR HOME BLOOD PRESSURE READINGS TO DR. Tenny CrawOSS.

## 2016-10-04 ENCOUNTER — Other Ambulatory Visit: Payer: Self-pay | Admitting: Internal Medicine

## 2016-11-11 ENCOUNTER — Other Ambulatory Visit: Payer: Self-pay | Admitting: Internal Medicine

## 2016-11-11 ENCOUNTER — Other Ambulatory Visit: Payer: Self-pay | Admitting: Interventional Cardiology

## 2016-11-11 DIAGNOSIS — I10 Essential (primary) hypertension: Secondary | ICD-10-CM

## 2016-11-11 NOTE — Telephone Encounter (Signed)
Date/Time Report Action User  09/26/2016 9:01 AM After Visit Summary Printed Lendon KaMichalene Wilson, RN  Patient Instructions   Your physician has recommended you make the following change in your medication:  1.) STOP TOPROL 2.) START DILTIAZEM 180 MG ONCE A DAY

## 2016-12-14 ENCOUNTER — Other Ambulatory Visit: Payer: Self-pay | Admitting: Internal Medicine

## 2017-01-16 ENCOUNTER — Other Ambulatory Visit: Payer: Self-pay | Admitting: Internal Medicine

## 2017-01-16 MED ORDER — HYDROCHLOROTHIAZIDE 25 MG PO TABS: 25.0000 mg | ORAL_TABLET | Freq: Every day | ORAL | 0 refills | Status: DC

## 2017-01-16 NOTE — Telephone Encounter (Signed)
Medication refilled for 90 days. Please schedule patient for an appointment. He has not been seen in our clinic since 2016.

## 2017-01-16 NOTE — Telephone Encounter (Signed)
Patient scheduled for 01-24-17. Vishal Sandlin,CMA  

## 2017-01-24 ENCOUNTER — Ambulatory Visit: Payer: Self-pay | Admitting: Internal Medicine

## 2017-02-03 ENCOUNTER — Ambulatory Visit: Payer: Self-pay | Admitting: Internal Medicine

## 2017-02-23 ENCOUNTER — Other Ambulatory Visit: Payer: Self-pay | Admitting: Internal Medicine

## 2017-02-27 ENCOUNTER — Ambulatory Visit: Payer: Self-pay | Admitting: Internal Medicine

## 2017-03-27 ENCOUNTER — Ambulatory Visit: Payer: Self-pay | Admitting: Internal Medicine

## 2017-07-17 ENCOUNTER — Other Ambulatory Visit: Payer: Self-pay | Admitting: Internal Medicine

## 2017-10-17 ENCOUNTER — Other Ambulatory Visit: Payer: Self-pay | Admitting: Internal Medicine

## 2017-10-17 DIAGNOSIS — I10 Essential (primary) hypertension: Secondary | ICD-10-CM

## 2017-11-24 ENCOUNTER — Other Ambulatory Visit: Payer: Self-pay | Admitting: Internal Medicine

## 2017-11-24 DIAGNOSIS — I10 Essential (primary) hypertension: Secondary | ICD-10-CM

## 2018-01-15 ENCOUNTER — Other Ambulatory Visit: Payer: Self-pay | Admitting: Internal Medicine

## 2018-01-18 ENCOUNTER — Other Ambulatory Visit: Payer: Self-pay | Admitting: Internal Medicine

## 2018-01-18 DIAGNOSIS — I10 Essential (primary) hypertension: Secondary | ICD-10-CM

## 2018-02-12 ENCOUNTER — Encounter: Payer: Self-pay | Admitting: Family Medicine

## 2018-02-12 ENCOUNTER — Ambulatory Visit (INDEPENDENT_AMBULATORY_CARE_PROVIDER_SITE_OTHER): Payer: BC Managed Care – PPO | Admitting: Family Medicine

## 2018-02-12 ENCOUNTER — Other Ambulatory Visit: Payer: Self-pay

## 2018-02-12 DIAGNOSIS — J069 Acute upper respiratory infection, unspecified: Secondary | ICD-10-CM | POA: Diagnosis not present

## 2018-02-12 DIAGNOSIS — Z23 Encounter for immunization: Secondary | ICD-10-CM

## 2018-02-12 NOTE — Progress Notes (Signed)
   Subjective   Patient ID: Jonathan Nolan    DOB: 03/30/1981, 37 y.o. male   MRN: [id number]  CC: "Flu"  HPI: Jonathan Nolan is a 37 y.o. male who presents for a same day appointment for the following:  URI  Has been sick for 11 days. Nasal discharge: Clear Medications tried: Claritin and Robitussin without improvement Sick contacts: Yes, daughter had similar symptoms 2 weeks ago  Symptoms Fever: Subjective Headache or face pain: Frontal headache over eye bilaterally Tooth pain: No Sneezing: Yes Scratchy throat: Yes Allergies: No Muscle aches: No Severe fatigue: No Stiff neck: No Shortness of breath: No Rash: No Sore throat or swollen glands: No  ROS: see HPI for pertinent.  PMFSH: HTN. Surgical history vasectomy.  Family history HTN.  Smoking status reviewed. Medications reviewed.  Objective   BP (!) 160/86   Pulse (!) 113   Temp 98.6 F (37 C) (Oral)   Wt 204 lb 12.8 oz (92.9 kg)   SpO2 99%   BMI 23.67 kg/m  Vitals and nursing note reviewed.  General: well nourished, well developed, NAD with non-toxic appearance HEENT: normocephalic, atraumatic, moist mucous membranes, apparent nasal sinus congestion, dry nares, mild erythema of the posterior pharynx without tonsillar edema Neck: supple, non-tender without lymphadenopathy Cardiovascular: regular rate and rhythm without murmurs, rubs, or gallops Lungs: clear to auscultation bilaterally with normal work of breathing Abdomen: soft, non-tender, non-distended, normoactive bowel sounds Skin: warm, dry, no rashes or lesions, cap refill < 2 seconds Extremities: warm and well perfused, normal tone, no edema  Assessment & Plan   Viral URI Acute.  Consistent with viral infection.  No signs of secondary bacterial infection.  Lungs are clear to auscultation.  Sinus congestion without signs of sinusitis.  Patient is afebrile.  Unlikely flu given the duration and progression of symptoms. - Advised patient to take Tylenol  every 6 hours as needed for pain or fever and drink plenty of fluids and take honey as needed for cough - Administered flu vaccine  Orders Placed This Encounter  Procedures  . Flu Vaccine QUAD 36+ mos IM   No orders of the defined types were placed in this encounter.   Durward Parcelavid McMullen, DO [facility], PGY-2 02/12/2018, 12:19 PM

## 2018-02-12 NOTE — Assessment & Plan Note (Signed)
Acute.  Consistent with viral infection.  No signs of secondary bacterial infection.  Lungs are clear to auscultation.  Sinus congestion without signs of sinusitis.  Patient is afebrile.  Unlikely flu given the duration and progression of symptoms. - Advised patient to take Tylenol every 6 hours as needed for pain or fever and drink plenty of fluids and take honey as needed for cough - Administered flu vaccine

## 2018-02-12 NOTE — Patient Instructions (Signed)
Thank you for coming in to see us today. Please see below to review our plan for today's visit.  Your symptoms are consistent with a viral upper respiratory tract infection.  You do not have the flu.  We will give you the flu vaccine today.  You can take Tylenol every 6 hours as needed for fever or pain.  I recommend a tablespoon of honey 3 times daily for cough.  I would avoid the decongestions and other cough medications at this time.  Drink plenty of water.  Your symptoms should improve over the course of the next week.  Be sure to wash your hands thoroughly prevent spreading this to other people including your family and friends.  Please call the clinic at [phone] if your symptoms worsen or you have any concerns. It was our pleasure to serve you.  Durward Parcelavid McMullen, DO [facility], PGY-2

## 2018-02-13 ENCOUNTER — Encounter: Payer: Self-pay | Admitting: Family Medicine

## 2018-03-06 ENCOUNTER — Ambulatory Visit: Payer: BC Managed Care – PPO | Admitting: Internal Medicine

## 2018-03-06 ENCOUNTER — Encounter: Payer: Self-pay | Admitting: Internal Medicine

## 2018-03-06 DIAGNOSIS — H04203 Unspecified epiphora, bilateral lacrimal glands: Secondary | ICD-10-CM

## 2018-03-06 MED ORDER — CETIRIZINE HCL 10 MG PO TABS: 10.0000 mg | ORAL_TABLET | Freq: Every day | ORAL | 11 refills | Status: DC

## 2018-03-06 MED ORDER — BENZONATATE 100 MG PO CAPS: 200.0000 mg | ORAL_CAPSULE | Freq: Three times a day (TID) | ORAL | 0 refills | Status: DC | PRN

## 2018-03-06 NOTE — Patient Instructions (Addendum)
It was nice meeting you today Jonathan Nolan!  Your symptoms are most likely due to a virus, however if you are not beginning to feel better in a week, please call to let us know and we can send in an antibiotic for you. In the meantime, you can take one Tessalon perle up to every 8 hours as needed for cough. For sore throat, you can take ibuprofen or Tylenol. Cepacol throat lozenges and Chloraseptic throat spray from the drug store can also help with sore throat.   Please also begin taking Zyrtec daily, as this will help with any allergies that may be contributing to your symptoms.   If you have any questions or concerns, please feel free to call the clinic.   Be well,  Dr. Natale MilchLancaster

## 2018-03-06 NOTE — Assessment & Plan Note (Signed)
Only R eye. Matting upon first awakening but no discharge. No redness. Description most consistent with allergic etiology, however would expect bilateral involvement and eye itching. Regardless, symptoms still more consistent with allergic conjunctivitis than bacterial. Viral conjunctivitis is possible given other likely viral URI symptoms, however no eye redness which would be expected. Will have patient resume Zyrtec for now. If no improvement in eye watering and other symptoms in about a week, encouraged patient to call and can consider sending in antibiotic for sinus infection (less likely causing eye symptoms but could be causing other reported symptoms).

## 2018-03-06 NOTE — Progress Notes (Signed)
   Subjective:   Patient: Jonathan Nolan       Birthdate: 06-24-1981       MRN: [id number]      HPI  Jonathan DockerCharles Zielke is a 37 y.o. male presenting for same day appt for watery eye.   Watery eye Only R eye affected. Started a few days ago. Eye is watery during the day and matted when he wakes up in the morning. No discharge during the day. No redness, itching, burning, changes in vision. Used some of his daughter's antibiotic eye ointment yesterday with no improvement in symptoms. Has not used any other eye drops or medication. Also with recent cough and sneezing which initially improved then returned. Is wondering if he may have an accompanying sinus infection. Is Prescribed Zyrtec for allergies but does not take this.   Smoking status reviewed. Patient is never smoker.   Review of Systems See HPI.     Objective:  Physical Exam  Constitutional: He is oriented to person, place, and time and well-developed, well-nourished, and in no distress.  HENT:  Head: Normocephalic and atraumatic.  Nose: Nose normal.  Mouth/Throat: Oropharynx is clear and moist. No oropharyngeal exudate.  Eyes:  No redness, watering, discharge of eyes. PERRLA, EOMI.   Neck: Normal range of motion. Neck supple.  Cardiovascular: Normal rate, regular rhythm and normal heart sounds.  No murmur heard. Pulmonary/Chest: Effort normal and breath sounds normal. No respiratory distress. He has no wheezes.  Lymphadenopathy:    He has no cervical adenopathy.  Neurological: He is alert and oriented to person, place, and time.  Skin: Skin is warm and dry.  Psychiatric: Affect and judgment normal.      Assessment & Plan:  Watery eye Only R eye. Matting upon first awakening but no discharge. No redness. Description most consistent with allergic etiology, however would expect bilateral involvement and eye itching. Regardless, symptoms still more consistent with allergic conjunctivitis than bacterial. Viral conjunctivitis is  possible given other likely viral URI symptoms, however no eye redness which would be expected. Will have patient resume Zyrtec for now. If no improvement in eye watering and other symptoms in about a week, encouraged patient to call and can consider sending in antibiotic for sinus infection (less likely causing eye symptoms but could be causing other reported symptoms).    Tarri AbernethyAbigail J Lancaster, MD, MPH PGY-3 [facility] Pager [phone]

## 2018-04-25 ENCOUNTER — Other Ambulatory Visit: Payer: Self-pay | Admitting: Internal Medicine

## 2018-12-19 ENCOUNTER — Other Ambulatory Visit: Payer: Self-pay | Admitting: Internal Medicine

## 2019-03-27 ENCOUNTER — Other Ambulatory Visit: Payer: Self-pay | Admitting: *Deleted

## 2019-03-28 MED ORDER — CETIRIZINE HCL 10 MG PO TABS: 10.0000 mg | ORAL_TABLET | Freq: Every day | ORAL | 11 refills | Status: DC

## 2019-07-10 ENCOUNTER — Other Ambulatory Visit: Payer: Self-pay

## 2019-07-10 DIAGNOSIS — Z20822 Contact with and (suspected) exposure to covid-19: Secondary | ICD-10-CM

## 2019-07-13 LAB — NOVEL CORONAVIRUS, NAA: SARS-CoV-2, NAA: NOT DETECTED

## 2020-03-04 ENCOUNTER — Other Ambulatory Visit: Payer: Self-pay

## 2020-03-04 MED ORDER — HYDROCHLOROTHIAZIDE 25 MG PO TABS: 25.0000 mg | ORAL_TABLET | Freq: Every day | ORAL | 0 refills | Status: DC

## 2020-03-05 ENCOUNTER — Ambulatory Visit: Payer: Self-pay | Attending: Family

## 2020-03-05 DIAGNOSIS — Z23 Encounter for immunization: Secondary | ICD-10-CM

## 2020-03-05 NOTE — Progress Notes (Signed)
   Covid-19 Vaccination Clinic  Name:  Jonathan Nolan    MRN: [id number] DOB: June 27, 1981  03/05/2020  [name] was observed post Covid-19 immunization for 15 minutes without incident. He was provided with Vaccine Information Sheet and instruction to access the V-Safe system.   [name] was instructed to call 911 with any severe reactions post vaccine: Marland Kitchen Difficulty breathing  . Swelling of face and throat  . A fast heartbeat  . A bad rash all over body  . Dizziness and weakness   Immunizations Administered    Name Date Dose VIS Date Route   Moderna COVID-19 Vaccine 03/05/2020  3:00 PM 0.5 mL 11/26/2019 Intramuscular   Manufacturer: Moderna   Lot: [id number]   NDC: 49971-820-99

## 2020-04-07 ENCOUNTER — Ambulatory Visit: Payer: Self-pay | Attending: Family

## 2020-04-07 DIAGNOSIS — Z23 Encounter for immunization: Secondary | ICD-10-CM

## 2020-04-07 NOTE — Progress Notes (Signed)
   Covid-19 Vaccination Clinic  Name:  Jonathan Nolan    MRN: [id number] DOB: 1981-05-30  04/07/2020  Mr. Keidel was observed post Covid-19 immunization for 15 minutes without incident. He was provided with Vaccine Information Sheet and instruction to access the V-Safe system.   Mr. Koziol was instructed to call 911 with any severe reactions post vaccine: Marland Kitchen Difficulty breathing  . Swelling of face and throat  . A fast heartbeat  . A bad rash all over body  . Dizziness and weakness   Immunizations Administered    Name Date Dose VIS Date Route   Moderna COVID-19 Vaccine 04/07/2020 12:17 PM 0.5 mL 11/26/2019 Intramuscular   Manufacturer: Moderna   Lot: [id number]   NDC: 25189-842-10

## 2020-06-06 ENCOUNTER — Other Ambulatory Visit: Payer: Self-pay | Admitting: Family Medicine

## 2020-06-08 ENCOUNTER — Other Ambulatory Visit: Payer: Self-pay | Admitting: *Deleted

## 2020-06-08 MED ORDER — CETIRIZINE HCL 10 MG PO TABS: 10.0000 mg | ORAL_TABLET | Freq: Every day | ORAL | 11 refills | Status: AC

## 2020-09-23 ENCOUNTER — Other Ambulatory Visit: Payer: Self-pay | Admitting: Family Medicine

## 2020-09-23 ENCOUNTER — Other Ambulatory Visit: Payer: Self-pay

## 2020-09-23 DIAGNOSIS — Z20822 Contact with and (suspected) exposure to covid-19: Secondary | ICD-10-CM

## 2020-09-24 LAB — SARS-COV-2, NAA 2 DAY TAT

## 2020-09-24 LAB — NOVEL CORONAVIRUS, NAA: SARS-CoV-2, NAA: NOT DETECTED

## 2020-09-24 MED ORDER — HYDROCHLOROTHIAZIDE 25 MG PO TABS: 25.0000 mg | ORAL_TABLET | Freq: Every day | ORAL | 3 refills | Status: AC

## 2022-05-02 ENCOUNTER — Telehealth: Payer: BC Managed Care – PPO | Admitting: Emergency Medicine

## 2022-05-02 DIAGNOSIS — J069 Acute upper respiratory infection, unspecified: Secondary | ICD-10-CM

## 2022-05-02 DIAGNOSIS — J302 Other seasonal allergic rhinitis: Secondary | ICD-10-CM

## 2022-05-02 MED ORDER — FLUTICASONE PROPIONATE 50 MCG/ACT NA SUSP: 2.0000 | Freq: Every day | NASAL | 0 refills | Status: AC

## 2022-05-02 MED ORDER — AZELASTINE HCL 0.05 % OP SOLN: 1.0000 [drp] | Freq: Two times a day (BID) | OPHTHALMIC | 12 refills | Status: AC

## 2022-05-02 NOTE — Patient Instructions (Signed)
?Jonathan Nolan, thank you for joining Cathlyn Parsons, NP for today's virtual visit.  While this provider is not your primary care provider (PCP), if your PCP is located in our provider database this encounter information will be shared with them immediately following your visit. ? ?Consent: ?(Patient) Jonathan Nolan provided verbal consent for this virtual visit at the beginning of the encounter. ? ?Current Medications: ? ?Current Outpatient Medications:  ?  azelastine (OPTIVAR) 0.05 % ophthalmic solution, Place 1 drop into both eyes 2 (two) times daily., Disp: 6 mL, Rfl: 12 ?  fluticasone (FLONASE) 50 MCG/ACT nasal spray, Place 2 sprays into both nostrils daily., Disp: 16 g, Rfl: 0 ?  cetirizine (ZYRTEC) 10 MG tablet, Take 1 tablet (10 mg total) by mouth daily., Disp: 30 tablet, Rfl: 11 ?  diltiazem (CARTIA XT) 180 MG 24 hr capsule, Take 1 capsule (180 mg total) by mouth daily. Please make overdue yearly appt with Dr. Tenny Craw before anymore refills. 1st attempt, Disp: 15 capsule, Rfl: 0 ?  hydrochlorothiazide (HYDRODIURIL) 25 MG tablet, Take 1 tablet (25 mg total) by mouth daily., Disp: 90 tablet, Rfl: 3 ?  Multiple Vitamin (MULTIVITAMIN) tablet, Take 1 tablet by mouth daily., Disp: , Rfl:   ? ?Medications ordered in this encounter:  ?Meds ordered this encounter  ?Medications  ? fluticasone (FLONASE) 50 MCG/ACT nasal spray  ?  Sig: Place 2 sprays into both nostrils daily.  ?  Dispense:  16 g  ?  Refill:  0  ? azelastine (OPTIVAR) 0.05 % ophthalmic solution  ?  Sig: Place 1 drop into both eyes 2 (two) times daily.  ?  Dispense:  6 mL  ?  Refill:  12  ?  ? ?*If you need refills on other medications prior to your next appointment, please contact your pharmacy* ? ?Follow-Up: ?Call back or seek an in-person evaluation if the symptoms worsen or if the condition fails to improve as anticipated. ? ?Other Instructions ?Continue taking Zyrtec.  Add Mucinex (generic guaifenesin is okay to use).  I have prescribed Flonase for  you to use every day for allergies.  Your insurance company may not cover this as it is over-the-counter but some insurance companies do.  I have also prescribed an eyedrop for your eye symptoms.  I also strongly recommend you use nasal saline several times a day.  You can use a saline nasal spray or try using saline irrigation, such as with a neti pot, several times a day while you are sick. Many neti pots come with salt packets premeasured to use to make saline. If you use your own salt, make sure it is kosher salt or sea salt (don't use table salt as it has iodine in it and you don't need that in your nose). Use distilled water to make saline. If you mix your own saline using your own salt, the recipe is 1/4 teaspoon salt in 1 cup warm water. Using saline irrigation can help prevent and treat sinus infections.  ? ? ?If you have been instructed to have an in-person evaluation today at a local Urgent Care facility, please use the link below. It will take you to a list of all of our available [facility], including address, phone number and hours of operation. Please do not delay care.  ?[facility] ? ?If you or a family member do not have a primary care provider, use the link below to schedule a visit and establish care. When you choose  a [facility] primary care physician or advanced practice provider, you gain a long-term partner in health. ?Find a Primary Care Provider ? ?Learn more about [facility]'s in-office and virtual care options: ?[facility] - Get Care Now  ?

## 2022-05-02 NOTE — Progress Notes (Signed)
?Virtual Visit Consent  ? ?Jonathan Nolan, you are scheduled for a virtual visit with a  provider today. Just as with appointments in the office, your consent must be obtained to participate. Your consent will be active for this visit and any virtual visit you may have with one of our providers in the next 365 days. If you have a MyChart account, a copy of this consent can be sent to you electronically. ? ?As this is a virtual visit, video technology does not allow for your provider to perform a traditional examination. This may limit your provider's ability to fully assess your condition. If your provider identifies any concerns that need to be evaluated in person or the need to arrange testing (such as labs, EKG, etc.), we will make arrangements to do so. Although advances in technology are sophisticated, we cannot ensure that it will always work on either your end or our end. If the connection with a video visit is poor, the visit may have to be switched to a telephone visit. With either a video or telephone visit, we are not always able to ensure that we have a secure connection. ? ?By engaging in this virtual visit, you consent to the provision of healthcare and authorize for your insurance to be billed (if applicable) for the services provided during this visit. Depending on your insurance coverage, you may receive a charge related to this service. ? ?I need to obtain your verbal consent now. Are you willing to proceed with your visit today? Jonathan Nolan has provided verbal consent on 05/02/2022 for a virtual visit (video or telephone). Cathlyn Parsons, NP ? ?Date: 05/02/2022 10:28 AM ? ?Virtual Visit via Video Note  ? ?Jonathan Nolan, connected with  Jonathan Nolan  ([id number], 01/04/1981) on 05/02/22 at 10:15 AM EDT by a video-enabled telemedicine application and verified that I am speaking with the correct person using two identifiers. ? ?Location: ?Patient: Virtual Visit Location Patient:  Home ?Provider: Virtual Visit Location Provider: Home Office ?  ?I discussed the limitations of evaluation and management by telemedicine and the availability of in person appointments. The patient expressed understanding and agreed to proceed.   ? ?History of Present Illness: ?Jonathan Nolan is a 41 y.o. who identifies as a male who was assigned male at birth, and is being seen today for multiple symptoms.  He has had a mild sore throat for a week, 2 days of mucousy, watery discharge from both eyes.  Yesterday the eye discharge was thicker and a little yellow, today it is thinner and more watery.  He is also congested and has clear drainage from his nose.  He is also coughing and occasionally has a productive cough.  This morning his sputum was green but as the day has progressed it is turning to be clear.  He has been using Zyrtec and Alka-Seltzer sinus medicine.  He denies shortness of breath, fever, chills, wheezing.  He does have seasonal allergies and the pollen is very much bothering him now.  His daughter had similar symptoms in the last couple of weeks she is finally starting to get better. ? ?HPI: HPI  ?Problems:  ?Patient Active Problem List  ? Diagnosis Date Noted  ? Watery eye 03/06/2018  ? Eustachian tube dysfunction 05/18/2015  ? Tachycardia 05/18/2015  ? Hypertension 10/15/2012  ?  ?Allergies: No Known Allergies ?Medications:  ?Current Outpatient Medications:  ?  azelastine (OPTIVAR) 0.05 % ophthalmic solution, Place 1 drop into both eyes 2 (  two) times daily., Disp: 6 mL, Rfl: 12 ?  fluticasone (FLONASE) 50 MCG/ACT nasal spray, Place 2 sprays into both nostrils daily., Disp: 16 g, Rfl: 0 ?  cetirizine (ZYRTEC) 10 MG tablet, Take 1 tablet (10 mg total) by mouth daily., Disp: 30 tablet, Rfl: 11 ?  diltiazem (CARTIA XT) 180 MG 24 hr capsule, Take 1 capsule (180 mg total) by mouth daily. Please make overdue yearly appt with Dr. Tenny Craw before anymore refills. 1st attempt, Disp: 15 capsule, Rfl: 0 ?   hydrochlorothiazide (HYDRODIURIL) 25 MG tablet, Take 1 tablet (25 mg total) by mouth daily., Disp: 90 tablet, Rfl: 3 ?  Multiple Vitamin (MULTIVITAMIN) tablet, Take 1 tablet by mouth daily., Disp: , Rfl:  ? ?Observations/Objective: ?Patient is well-developed, well-nourished in no acute distress.  ?Resting comfortably  at home.  ?Head is normocephalic, atraumatic.  ?No labored breathing.  ?Speech is clear and coherent with logical content.  ?Patient is alert and oriented at baseline.  ?On video bilateral conjunctive are mildly injected.  I do not see any eye drainage on video. ? ?Assessment and Plan: ?1. Viral upper respiratory tract infection ? ?2. Seasonal allergic rhinitis, unspecified trigger ? ?I suspect he has seasonal allergies as well as a URI.  Patient to continue taking Zyrtec.  We will add Flonase.  Prescribed antihistamine eyedrops.  Patient to add Mucinex  and nasal saline to his treatment plan.  Discussed reasons for seeking higher level of care ? ?Follow Up Instructions: ?I discussed the assessment and treatment plan with the patient. The patient was provided an opportunity to ask questions and all were answered. The patient agreed with the plan and demonstrated an understanding of the instructions.  A copy of instructions were sent to the patient via MyChart unless otherwise noted below.  ? ?The patient was advised to call back or seek an in-person evaluation if the symptoms worsen or if the condition fails to improve as anticipated. ? ?Time:  ?I spent 12 minutes with the patient via telehealth technology discussing the above problems/concerns.   ? ?Cathlyn Parsons, NP ?

## 2022-05-31 ENCOUNTER — Encounter: Payer: Self-pay | Admitting: *Deleted

## 2023-07-05 ENCOUNTER — Telehealth: Payer: BC Managed Care – PPO | Admitting: Nurse Practitioner

## 2023-07-05 DIAGNOSIS — I10 Essential (primary) hypertension: Secondary | ICD-10-CM | POA: Diagnosis not present

## 2023-07-05 MED ORDER — LISINOPRIL 20 MG PO TABS: 20.0000 mg | ORAL_TABLET | Freq: Every day | ORAL | 0 refills | Status: DC

## 2023-07-05 NOTE — Patient Instructions (Signed)
  Jonathan Nolan, thank you for joining Bennie Pierini, FNP for today's virtual visit.  While this provider is not your primary care provider (PCP), if your PCP is located in our provider database this encounter information will be shared with them immediately following your visit.   A [facility] MyChart account gives you access to today's visit and all your visits, tests, and labs performed at [facility] " click here if you don't have a [facility] MyChart account or go to [email or web address]  Consent: (Patient) Jonathan Nolan provided verbal consent for this virtual visit at the beginning of the encounter.  Current Medications:  Current Outpatient Medications:    lisinopril (ZESTRIL) 20 MG tablet, Take 1 tablet (20 mg total) by mouth daily., Disp: 30 tablet, Rfl: 0   azelastine (OPTIVAR) 0.05 % ophthalmic solution, Place 1 drop into both eyes 2 (two) times daily., Disp: 6 mL, Rfl: 12   cetirizine (ZYRTEC) 10 MG tablet, Take 1 tablet (10 mg total) by mouth daily., Disp: 30 tablet, Rfl: 11   diltiazem (CARTIA XT) 180 MG 24 hr capsule, Take 1 capsule (180 mg total) by mouth daily. Please make overdue yearly appt with Dr. Tenny Craw before anymore refills. 1st attempt, Disp: 15 capsule, Rfl: 0   fluticasone (FLONASE) 50 MCG/ACT nasal spray, Place 2 sprays into both nostrils daily., Disp: 16 g, Rfl: 0   hydrochlorothiazide (HYDRODIURIL) 25 MG tablet, Take 1 tablet (25 mg total) by mouth daily., Disp: 90 tablet, Rfl: 3   Multiple Vitamin (MULTIVITAMIN) tablet, Take 1 tablet by mouth daily., Disp: , Rfl:    Medications ordered in this encounter:  Meds ordered this encounter  Medications   lisinopril (ZESTRIL) 20 MG tablet    Sig: Take 1 tablet (20 mg total) by mouth daily.    Dispense:  30 tablet    Refill:  0    Order Specific Question:   Supervising Provider    Answer:   Merrilee Jansky [id number]     *If you need refills on other medications prior to your next  appointment, please contact your pharmacy*  Follow-Up: Call back or seek an in-person evaluation if the symptoms worsen or if the condition fails to improve as anticipated.  [facility] [phone]  Other Instructions Did find on chart that he use to be on lisinopril. I went ahead and refilled his lisinopril at 20mg . He was told that could only have 30 day supply. Needs to follow up with his PCP with lab results or have redone in order to get more medication. LoW sodium diet   If you have been instructed to have an in-person evaluation today at a local Urgent Care facility, please use the link below. It will take you to a list of all of our available [facility], including address, phone number and hours of operation. Please do not delay care.  [facility]  If you or a family member do not have a primary care provider, use the link below to schedule a visit and establish care. When you choose a [facility] primary care physician or advanced practice provider, you gain a long-term partner in health. Find a Primary Care Provider  Learn more about [facility]'s in-office and virtual care options: [facility] - Get Care Now

## 2023-07-05 NOTE — Progress Notes (Signed)
Virtual Visit Consent   Jonathan Nolan, you are scheduled for a virtual visit with Mary-Margaret Daphine Deutscher, FNP, a [facility] provider, today.     Just as with appointments in the office, your consent must be obtained to participate.  Your consent will be active for this visit and any virtual visit you may have with one of our providers in the next 365 days.     If you have a MyChart account, a copy of this consent can be sent to you electronically.  All virtual visits are billed to your insurance company just like a traditional visit in the office.    As this is a virtual visit, video technology does not allow for your provider to perform a traditional examination.  This may limit your provider's ability to fully assess your condition.  If your provider identifies any concerns that need to be evaluated in person or the need to arrange testing (such as labs, EKG, etc.), we will make arrangements to do so.     Although advances in technology are sophisticated, we cannot ensure that it will always work on either your end or our end.  If the connection with a video visit is poor, the visit may have to be switched to a telephone visit.  With either a video or telephone visit, we are not always able to ensure that we have a secure connection.     I need to obtain your verbal consent now.   Are you willing to proceed with your visit today? YES   Jonathan Nolan has provided verbal consent on 07/05/2023 for a virtual visit (video or telephone).   Mary-Margaret Daphine Deutscher, FNP   Date: 07/05/2023 10:45 AM   Virtual Visit via Video Note   I, Mary-Margaret Daphine Deutscher, connected with Jonathan Nolan ([id number], [birth year]) on 07/05/23 at 11:00 AM EDT by a video-enabled telemedicine application and verified that I am speaking with the correct person using two identifiers.  Location: Patient: Virtual Visit Location Patient: Home Provider: Virtual Visit Location Provider: Mobile   I discussed the limitations of  evaluation and management by telemedicine and the availability of in person appointments. The patient expressed understanding and agreed to proceed.    History of Present Illness: Jonathan Nolan is a 42 y.o. who identifies as a male who was assigned male at birth, and is being seen today for medication refill.  HPI: Patient has been out of town 2 weeks ago.  Developed a nose bleed. Went to the hospital. Was given fluids and was put back on lisinopril. He was told all his labs were normal. He has been taking lisinopril 20mg  daily and has been doing well. Denies headaches or nose bleed.  * he does not,  nor do we have access to hospital reocords or labs that were drawn.  Medication Refill    ROS  Problems:  Patient Active Problem List   Diagnosis Date Noted   Watery eye 03/06/2018   Eustachian tube dysfunction 05/18/2015   Tachycardia 05/18/2015   Hypertension 10/15/2012    Allergies: No Known Allergies Medications:  Current Outpatient Medications:    azelastine (OPTIVAR) 0.05 % ophthalmic solution, Place 1 drop into both eyes 2 (two) times daily., Disp: 6 mL, Rfl: 12   cetirizine (ZYRTEC) 10 MG tablet, Take 1 tablet (10 mg total) by mouth daily., Disp: 30 tablet, Rfl: 11   diltiazem (CARTIA XT) 180 MG 24 hr capsule, Take 1 capsule (180 mg total) by mouth daily. Please make overdue yearly appt  with Dr. Tenny Craw before anymore refills. 1st attempt, Disp: 15 capsule, Rfl: 0   fluticasone (FLONASE) 50 MCG/ACT nasal spray, Place 2 sprays into both nostrils daily., Disp: 16 g, Rfl: 0   hydrochlorothiazide (HYDRODIURIL) 25 MG tablet, Take 1 tablet (25 mg total) by mouth daily., Disp: 90 tablet, Rfl: 3   Multiple Vitamin (MULTIVITAMIN) tablet, Take 1 tablet by mouth daily., Disp: , Rfl:   Observations/Objective: Patient is well-developed, well-nourished in no acute distress.  Resting comfortably  at home.  Head is normocephalic, atraumatic.  No labored breathing.  Speech is clear and coherent  with logical content.  Patient is alert and oriented at baseline.    Assessment and Plan:  Jonathan Nolan in today with chief complaint of Medication Refill   1. Primary hypertension Did find on chart that he use to be on lisinopril. I went ahead and refilled his lisinopril at 20mg . He was told that could only have 30 day supply. Needs to follow up with his PCP with lab results or have redone in order to get more medication. LoW sodium diet - lisinopril (ZESTRIL) 20 MG tablet; Take 1 tablet (20 mg total) by mouth daily.  Dispense: 30 tablet; Refill: 0    Follow Up Instructions: I discussed the assessment and treatment plan with the patient. The patient was provided an opportunity to ask questions and all were answered. The patient agreed with the plan and demonstrated an understanding of the instructions.  A copy of instructions were sent to the patient via MyChart.  The patient was advised to call back or seek an in-person evaluation if the symptoms worsen or if the condition fails to improve as anticipated.  Time:  I spent 7 minutes with the patient via telehealth technology discussing the above problems/concerns.    Mary-Margaret Daphine Deutscher, FNP

## 2023-08-05 ENCOUNTER — Other Ambulatory Visit: Payer: Self-pay | Admitting: Nurse Practitioner

## 2023-08-05 DIAGNOSIS — I10 Essential (primary) hypertension: Secondary | ICD-10-CM

## 2024-03-07 ENCOUNTER — Other Ambulatory Visit: Payer: Self-pay | Admitting: Nurse Practitioner

## 2024-03-07 DIAGNOSIS — I10 Essential (primary) hypertension: Secondary | ICD-10-CM

## 2024-03-08 MED ORDER — LISINOPRIL 20 MG PO TABS
20.0000 mg | ORAL_TABLET | Freq: Every day | ORAL | 2 refills | Status: DC
Start: 2024-03-08 — End: 2024-06-06

## 2024-06-06 ENCOUNTER — Other Ambulatory Visit: Payer: Self-pay | Admitting: Student

## 2024-06-06 DIAGNOSIS — I10 Essential (primary) hypertension: Secondary | ICD-10-CM

## 2024-12-26 DEATH — deceased

## 2024-12-30 ENCOUNTER — Telehealth: Payer: Self-pay | Admitting: *Deleted

## 2024-12-30 NOTE — Telephone Encounter (Signed)
 VM full, contacting spouse to talk about pt's death certificate, Ronal Lunger FNP is not pt's PCP, she will not be able to sign. Dr Laurier Hugger is listed as his PCP, but I am unable to route this message to her, she is not listed in the system.

## 2024-12-30 NOTE — Telephone Encounter (Signed)
 Copied from CRM [id number]. Topic: General - Deceased Patient >> 01-23-25 11:54 AM Farrel B wrote: Name of caller: Adrion Menz, spouse is calling to have the nurse call her [phone] in regards to the Death Certificate of pt

## 2024-12-31 NOTE — Telephone Encounter (Signed)
 Talked w/ spouse today, she did get pt's death certificate signed.
